# Patient Record
Sex: Female | Born: 1950 | Race: Black or African American | Hispanic: No | Marital: Single | Smoking: Current every day smoker
Health system: Southern US, Community
[De-identification: ages and names within clinical notes are randomized; demographics above are authoritative.]

## PROBLEM LIST (undated history)

## (undated) DIAGNOSIS — E785 Hyperlipidemia, unspecified: Secondary | ICD-10-CM

## (undated) DIAGNOSIS — E119 Type 2 diabetes mellitus without complications: Secondary | ICD-10-CM

## (undated) DIAGNOSIS — I10 Essential (primary) hypertension: Secondary | ICD-10-CM

## (undated) HISTORY — PX: OTHER SURGICAL HISTORY: SHX169

---

## 2015-01-12 ENCOUNTER — Emergency Department (HOSPITAL_BASED_OUTPATIENT_CLINIC_OR_DEPARTMENT_OTHER): Payer: Self-pay

## 2015-01-12 ENCOUNTER — Emergency Department (HOSPITAL_BASED_OUTPATIENT_CLINIC_OR_DEPARTMENT_OTHER)
Admission: EM | Admit: 2015-01-12 | Discharge: 2015-01-12 | Disposition: A | Discharge status: Home / Self Care | Payer: Self-pay | Attending: Emergency Medicine | Admitting: Emergency Medicine

## 2015-01-12 ENCOUNTER — Encounter (HOSPITAL_BASED_OUTPATIENT_CLINIC_OR_DEPARTMENT_OTHER): Payer: Self-pay | Admitting: Emergency Medicine

## 2015-01-12 DIAGNOSIS — E119 Type 2 diabetes mellitus without complications: Secondary | ICD-10-CM | POA: Insufficient documentation

## 2015-01-12 DIAGNOSIS — Z7982 Long term (current) use of aspirin: Secondary | ICD-10-CM | POA: Insufficient documentation

## 2015-01-12 DIAGNOSIS — I10 Essential (primary) hypertension: Secondary | ICD-10-CM | POA: Insufficient documentation

## 2015-01-12 DIAGNOSIS — Z720 Tobacco use: Secondary | ICD-10-CM | POA: Insufficient documentation

## 2015-01-12 DIAGNOSIS — K5900 Constipation, unspecified: Secondary | ICD-10-CM | POA: Insufficient documentation

## 2015-01-12 DIAGNOSIS — K5904 Chronic idiopathic constipation: Secondary | ICD-10-CM

## 2015-01-12 DIAGNOSIS — E785 Hyperlipidemia, unspecified: Secondary | ICD-10-CM | POA: Insufficient documentation

## 2015-01-12 DIAGNOSIS — Z79899 Other long term (current) drug therapy: Secondary | ICD-10-CM | POA: Insufficient documentation

## 2015-01-12 HISTORY — DX: Essential (primary) hypertension: I10

## 2015-01-12 HISTORY — DX: Hyperlipidemia, unspecified: E78.5

## 2015-01-12 HISTORY — DX: Type 2 diabetes mellitus without complications: E11.9

## 2015-01-12 MED ORDER — MINERAL OIL RE ENEM: 1.0000 | ENEMA | Freq: Once | RECTAL | Status: DC

## 2015-01-12 MED ORDER — DOCUSATE SODIUM 100 MG PO CAPS: 100.0000 mg | ORAL_CAPSULE | Freq: Two times a day (BID) | ORAL | Status: AC

## 2015-01-12 NOTE — ED Notes (Signed)
Pt sts constipation x4 days, has used suppositories at home without relief; has fullness and bloating in abdomen. Pain to RUQ, pt's abdomen appears to be distended and firm from triage assessment.

## 2015-01-12 NOTE — Discharge Instructions (Signed)
You have constipation, no obstruction seen. Please see your primary care doctor if not getting better to figure out the cause of your constipation.  Please return to the ER if your symptoms worsen; you have increased pain, inability to keep any medications, fluids down, and you stop passing gas. Otherwise see the outpatient doctor as requested.    Constipation Constipation is when a person has fewer than three bowel movements a week, has difficulty having a bowel movement, or has stools that are dry, hard, or larger than normal. As people grow older, constipation is more common. If you try to fix constipation with medicines that make you have a bowel movement (laxatives), the problem may get worse. Long-term laxative use may cause the muscles of the colon to become weak. A low-fiber diet, not taking in enough fluids, and taking certain medicines may make constipation worse.  CAUSES   Certain medicines, such as antidepressants, pain medicine, iron supplements, antacids, and water pills.   Certain diseases, such as diabetes, irritable bowel syndrome (IBS), thyroid disease, or depression.   Not drinking enough water.   Not eating enough fiber-rich foods.   Stress or travel.   Lack of physical activity or exercise.   Ignoring the urge to have a bowel movement.   Using laxatives too much.  SIGNS AND SYMPTOMS   Having fewer than three bowel movements a week.   Straining to have a bowel movement.   Having stools that are hard, dry, or larger than normal.   Feeling full or bloated.   Pain in the lower abdomen.   Not feeling relief after having a bowel movement.  DIAGNOSIS  Your health care provider will take a medical history and perform a physical exam. Further testing may be done for severe constipation. Some tests may include:  A barium enema X-ray to examine your rectum, colon, and, sometimes, your small intestine.   A sigmoidoscopy to examine your lower colon.    A colonoscopy to examine your entire colon. TREATMENT  Treatment will depend on the severity of your constipation and what is causing it. Some dietary treatments include drinking more fluids and eating more fiber-rich foods. Lifestyle treatments may include regular exercise. If these diet and lifestyle recommendations do not help, your health care provider may recommend taking over-the-counter laxative medicines to help you have bowel movements. Prescription medicines may be prescribed if over-the-counter medicines do not work.  HOME CARE INSTRUCTIONS   Eat foods that have a lot of fiber, such as fruits, vegetables, whole grains, and beans.  Limit foods high in fat and processed sugars, such as french fries, hamburgers, cookies, candies, and soda.   A fiber supplement may be added to your diet if you cannot get enough fiber from foods.   Drink enough fluids to keep your urine clear or pale yellow.   Exercise regularly or as directed by your health care provider.   Go to the restroom when you have the urge to go. Do not hold it.   Only take over-the-counter or prescription medicines as directed by your health care provider. Do not take other medicines for constipation without talking to your health care provider first.  SEEK IMMEDIATE MEDICAL CARE IF:   You have bright red blood in your stool.   Your constipation lasts for more than 4 days or gets worse.   You have abdominal or rectal pain.   You have thin, pencil-like stools.   You have unexplained weight loss. MAKE SURE YOU:  Understand these instructions.  Will watch your condition.  Will get help right away if you are not doing well or get worse. Document Released: 08/28/2004 Document Revised: 12/05/2013 Document Reviewed: 09/11/2013 [facility] Patient Information 2015 [place], [place]. This information is not intended to replace advice given to you by your health care provider. Make sure you discuss any questions  you have with your health care provider.

## 2015-01-12 NOTE — ED Provider Notes (Signed)
CSN: [id number]     Arrival date & time 01/12/15  0056 History   First MD Initiated Contact with Patient 01/12/15 0130     Chief Complaint  Patient presents with  . Constipation     (Consider location/radiation/quality/duration/timing/severity/associated sxs/prior Treatment) HPI Comments: Pt with hx of DM comes in with cc of constipation x 4 days. She has no hx of constipation, no hx of abd surgery. Reports attempting OTC meds with no relief. The last normal BM was 4 days ago, now she is having tiny stools with meds. She has no bloating, no n/v/ weight loss, night sweats. No uti like sx. Diabetic for 2 years.  Patient is a 64 y.o. female presenting with constipation. The history is provided by the patient.  Constipation Associated symptoms: no abdominal pain, no dysuria, no fever, no nausea and no vomiting     Past Medical History  Diagnosis Date  . Hypertension   . Diabetes mellitus without complication   . Hyperlipidemia    Past Surgical History  Procedure Laterality Date  . Cysts breast     No family history on file. History  Substance Use Topics  . Smoking status: Current Every Day Smoker -- 0.50 packs/day  . Smokeless tobacco: Not on file  . Alcohol Use: No   OB History    No data available     Review of Systems  Constitutional: Negative for fever, diaphoresis, activity change and unexpected weight change.  Gastrointestinal: Positive for constipation. Negative for nausea, vomiting and abdominal pain.  Genitourinary: Negative for dysuria and flank pain.  Allergic/Immunologic: Negative for food allergies.      Allergies  Review of patient's allergies indicates no known allergies.  Home Medications   Prior to Admission medications   Medication Sig Start Date End Date Taking? Authorizing Provider  aspirin 81 MG tablet Take 81 mg by mouth daily.   Yes Historical Provider, MD  docusate sodium (COLACE) 100 MG capsule Take 1 capsule (100 mg total) by mouth 2 (two)  times daily. 01/12/15   Derwood Kaplan, MD  glipiZIDE (GLUCOTROL) 5 MG tablet Take by mouth daily before breakfast.   Yes Historical Provider, MD  hydrochlorothiazide (HYDRODIURIL) 25 MG tablet Take 25 mg by mouth daily.   Yes Historical Provider, MD  losartan (COZAAR) 50 MG tablet Take 50 mg by mouth daily.   Yes Historical Provider, MD  metFORMIN (GLUCOPHAGE) 500 MG tablet Take 500 mg by mouth 2 (two) times daily with a meal.   Yes Historical Provider, MD  metoprolol (LOPRESSOR) 50 MG tablet Take 50 mg by mouth 2 (two) times daily.   Yes Historical Provider, MD  mineral oil enema Place 133 mLs (1 enema total) rectally once. 01/12/15   Derwood Kaplan, MD  rosuvastatin (CRESTOR) 20 MG tablet Take 20 mg by mouth daily.   Yes Historical Provider, MD   BP 154/88 mmHg  Pulse 72  Temp(Src) 98.1 F (36.7 C) (Oral)  Resp 16  Ht 5' 5.5" (1.664 m)  Wt 153 lb (69.4 kg)  BMI 25.06 kg/m2  SpO2 99% Physical Exam  Constitutional: She is oriented to person, place, and time. She appears well-developed and well-nourished.  HENT:  Head: Normocephalic and atraumatic.  Eyes: EOM are normal. Pupils are equal, round, and reactive to light.  Neck: Neck supple.  Cardiovascular: Normal rate, regular rhythm and normal heart sounds.   No murmur heard. Pulmonary/Chest: Effort normal. No respiratory distress.  Abdominal: Soft. She exhibits no distension. There is no tenderness. There  is no rebound and no guarding.  Soft stool in the vault, and the rectal vault didn't appear full  Neurological: She is alert and oriented to person, place, and time.  Skin: Skin is warm and dry.  Nursing note and vitals reviewed.   ED Course  Procedures (including critical care time) Labs Review Labs Reviewed - No data to display  Imaging Review Dg Abd 1 View  01/12/2015   CLINICAL DATA:  Constipation for 4 days. Fullness and bloating in the abdomen. Right upper quadrant pain. Distended and firm abdomen.  EXAM: ABDOMEN - 1  VIEW  COMPARISON:  None.  FINDINGS: Gas and stool throughout the colon. No small or large bowel distention. No radiopaque stones. Calcification in the pelvis likely represents a fibroid. Visualized bones appear intact. No radiopaque stones.  IMPRESSION: Nonobstructive bowel gas pattern with diffusely stool-filled colon. Calcified fibroid in the pelvis.   Electronically Signed   By: Burman NievesWilliam  Stevens M.D.   On: 01/12/2015 02:11     EKG Interpretation None      MDM   Final diagnoses:  Functional constipation    Pt comes in with constipation. KUB shows no obstruction. She is passing flatus, and there is no abd surgery. This is a new development for the patient, and so she has been advised to increase fiber in the diet, take OTc meds and drink plenty of fluids - and if not better see PCP for further investigation. PT requesting enema - which has been prescribed.   Derwood KaplanAnkit Gloriann Riede, MD 01/12/15 [phone]

## 2015-02-09 IMAGING — CR DG ABDOMEN 1V
1 series · 1 of 1 positions shown · non-contrast
Comparison: None.

CLINICAL DATA: Constipation for 4 days. Fullness and bloating in
the abdomen. Right upper quadrant pain. Distended and firm abdomen.

EXAM:
ABDOMEN - 1 VIEW

[t abdomen supine]
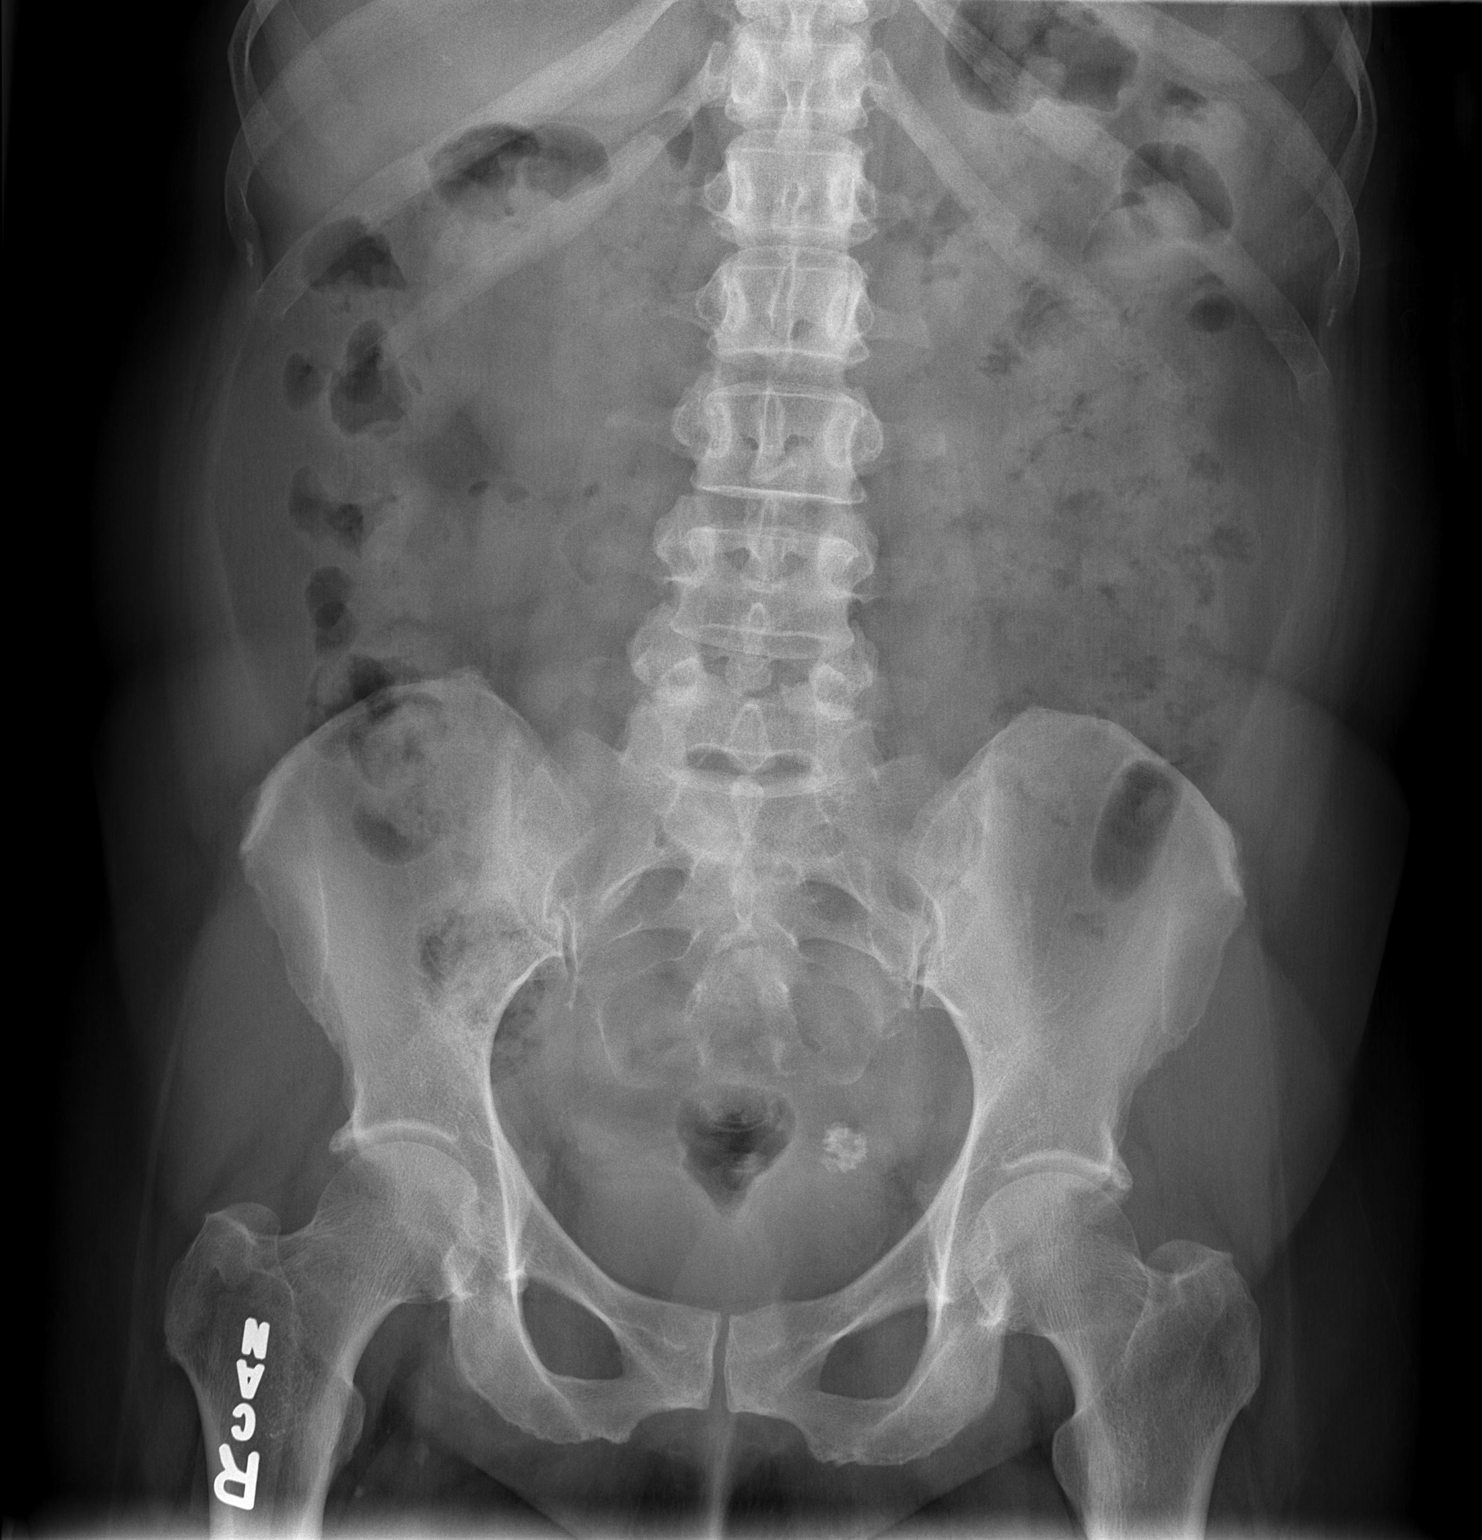

[1 of 1 positions shown; findings below may reference images not displayed]

FINDINGS: Gas and stool throughout the colon. No small or large bowel
distention. No radiopaque stones. Calcification in the pelvis likely
represents a fibroid. Visualized bones appear intact. No radiopaque
stones.
IMPRESSION: Nonobstructive bowel gas pattern with diffusely stool-filled colon.
Calcified fibroid in the pelvis.

## 2017-05-03 ENCOUNTER — Other Ambulatory Visit: Payer: Self-pay | Admitting: Family Medicine

## 2017-05-03 DIAGNOSIS — I701 Atherosclerosis of renal artery: Secondary | ICD-10-CM

## 2017-05-06 ENCOUNTER — Ambulatory Visit
Admission: RE | Admit: 2017-05-06 | Discharge: 2017-05-06 | Disposition: A | Discharge status: Home / Self Care | Payer: Self-pay | Source: Ambulatory Visit | Attending: Family Medicine | Admitting: Family Medicine

## 2017-05-06 ENCOUNTER — Other Ambulatory Visit: Payer: Self-pay | Admitting: *Deleted

## 2017-05-06 DIAGNOSIS — I10 Essential (primary) hypertension: Secondary | ICD-10-CM

## 2017-05-06 DIAGNOSIS — I701 Atherosclerosis of renal artery: Secondary | ICD-10-CM

## 2017-05-06 HISTORY — PX: IR RADIOLOGIST EVAL & MGMT: IMG5224

## 2017-05-06 NOTE — Consult Note (Signed)
Chief Complaint: Patient was seen in consultation today for  Chief Complaint  Patient presents with  . Advice Only    Consult for Renal Artery Stenosis/Hypertension   at the request of Leslie Meza  Referring Physician(s): Leslie Meza  History of Present Illness: Leslie Meza is a 66 y.o. female with a history of uncontrolled hypertension. She also has a history of diabetes mellitus, hypercholesterolemia, MI, and TIA. She was on 4 medications for high blood pressure. The beta blocker was recently removed. Her blood pressure has been difficult to control and usually her systolic runs over 170. She also recently had an episode of a hypertensive crisis earlier this year. At that time, her blood pressure reached 210/82. This was successfully treated with clonidine. Currently she denies any chest pain or shortness of breath. He is asymptomatic at this time. She did undergo a screening renal artery Doppler examination. Recent blood work earlier this year demonstrates that her renal function is normal. Doppler suggests significant stenosis in the right renal artery and severe atrophy of the left kidney.  Past Medical History:  Diagnosis Date  . Diabetes mellitus without complication   . Hyperlipidemia   . Hypertension     Past Surgical History:  Procedure Laterality Date  . cysts breast      Allergies: Influenza vaccines  Medications: Prior to Admission medications   Medication Sig Start Date End Date Taking? Authorizing Provider  albuterol (PROVENTIL HFA;VENTOLIN HFA) 108 (90 Base) MCG/ACT inhaler Inhale 1 puff into the lungs every 6 (six) hours as needed for wheezing or shortness of breath.   Yes [provider]  amLODipine (NORVASC) 5 MG tablet Take 5 mg by mouth daily.   Yes [provider]  aspirin 325 MG tablet Take 325 mg by mouth daily.   Yes [provider]  atorvastatin (LIPITOR) 40 MG tablet Take 40 mg by mouth daily.   Yes [provider]  benazepril (LOTENSIN) 40 MG tablet Take 40 mg by mouth daily.   Yes [provider]  glipiZIDE (GLUCOTROL) 5 MG tablet Take by mouth daily before breakfast.   Yes [provider]  loratadine (CLARITIN) 10 MG tablet Take 10 mg by mouth daily as needed for allergies.   Yes [provider]  metFORMIN (GLUCOPHAGE) 500 MG tablet Take 500 mg by mouth 2 (two) times daily with a meal.   Yes [provider]  polyethylene glycol (MIRALAX / GLYCOLAX) packet Take 17 g by mouth daily as needed.   Yes [provider]  ranitidine (ZANTAC) 150 MG capsule Take 150 mg by mouth 2 (two) times daily.   Yes [provider]  spironolactone (ALDACTONE) 25 MG tablet Take 25 mg by mouth daily.   Yes [provider]  aspirin 81 MG tablet Take 81 mg by mouth daily.    [provider]  docusate sodium (COLACE) 100 MG capsule Take 1 capsule (100 mg total) by mouth 2 (two) times daily. Patient not taking: Reported on 05/06/2017 01/12/15   Derwood Kaplan, MD  hydrochlorothiazide (HYDRODIURIL) 25 MG tablet Take 25 mg by mouth daily.    [provider]  losartan (COZAAR) 50 MG tablet Take 50 mg by mouth daily.    [provider]  metoprolol (LOPRESSOR) 50 MG tablet Take 50 mg by mouth 2 (two) times daily.    [provider]  mineral oil enema Place 133 mLs (1 enema total) rectally once. Patient not taking: Reported on 05/06/2017 01/12/15   Rhunette Croft,  Ankit, MD  rosuvastatin (CRESTOR) 20 MG tablet Take 20 mg by mouth daily.    [provider]     No family history on file.  Social History   Social History  . Marital status: Single    Spouse name: N/A  . Number of children: N/A  . Years of education: N/A   Social History Main Topics  . Smoking status: Current Every Day Smoker    Packs/day: 0.50  . Smokeless tobacco: Not on file  . Alcohol use No  . Drug use: No  . Sexual activity: Not on file    Other Topics Concern  . Not on file   Social History Narrative  . No narrative on file     Review of Systems: A 12 point ROS discussed and pertinent positives are indicated in the HPI above.  All other systems are negative.  Review of Systems  Vital Signs: BP (!) 152/66   Pulse (!) 52   Temp 98.1 F (36.7 C) (Oral)   Resp 14   Ht 5' 5.5" (1.664 m)   Wt 140 lb (63.5 kg)   SpO2 99%   BMI 22.94 kg/m   Physical Exam  Constitutional: She is oriented to person, place, and time. She appears well-developed and well-nourished.  HENT:  Head: Normocephalic and atraumatic.  Cardiovascular: Normal rate, regular rhythm and normal heart sounds.   Femoral pulses are 2+ bilaterally  Dorsalis pedis and posterior tibial pulses are nonpalpable but dopplerable bilaterally.  Pulmonary/Chest: Effort normal and breath sounds normal.  Neurological: She is alert and oriented to person, place, and time.    Mallampati Score:     Imaging: No results found.  Labs:  CBC: No results for input(s): WBC, HGB, HCT, PLT in the last 8760 hours.  COAGS: No results for input(s): INR, APTT in the last 8760 hours.  BMP: No results for input(s): NA, K, CL, CO2, GLUCOSE, BUN, CALCIUM, CREATININE, GFRNONAA, GFRAA in the last 8760 hours.  Invalid input(s): CMP  LIVER FUNCTION TESTS: No results for input(s): BILITOT, AST, ALT, ALKPHOS, PROT, ALBUMIN in the last 8760 hours.  TUMOR MARKERS: No results for input(s): AFPTM, CEA, CA199, CHROMGRNA in the last 8760 hours.  Assessment and Plan:  Ms. Leslie Meza has uncontrolled hypertension and an abnormal renal artery Doppler examination. CT angiogram of her renal arteries will be performed. If she does have significant narrowing in her right renal artery, this should be amenable to endovascular treatment such as angioplasty and stenting. I did discuss the procedure, risks, and benefits. She understands. Her questions were answered.  Thank you for this  interesting consult.  I greatly enjoyed meeting Leslie Meza and look forward to participating in their care.  A copy of this report was sent to the requesting provider on this date.  Electronically Signed: [name], ART A 05/06/2017, 4:10 PM   I spent a total of  40 Minutes   in face to face in clinical consultation, greater than 50% of which was counseling/coordinating care for renal artery stenosis.

## 2017-05-24 ENCOUNTER — Other Ambulatory Visit (HOSPITAL_COMMUNITY): Payer: Self-pay | Admitting: Interventional Radiology

## 2017-05-24 DIAGNOSIS — I701 Atherosclerosis of renal artery: Secondary | ICD-10-CM

## 2017-05-28 ENCOUNTER — Encounter: Payer: Self-pay | Admitting: Family Medicine

## 2017-06-02 ENCOUNTER — Ambulatory Visit
Admission: RE | Admit: 2017-06-02 | Discharge: 2017-06-02 | Disposition: A | Discharge status: Home / Self Care | Payer: Medicaid Other | Source: Ambulatory Visit | Attending: Interventional Radiology | Admitting: Interventional Radiology

## 2017-06-02 DIAGNOSIS — I701 Atherosclerosis of renal artery: Secondary | ICD-10-CM

## 2017-06-02 HISTORY — PX: IR RADIOLOGIST EVAL & MGMT: IMG5224

## 2017-06-02 NOTE — Progress Notes (Signed)
Patient ID: Leslie Meza, female   DOB: 11/16/1951, 66 y.o.   MRN: [id number]       Chief Complaint: Patient was seen in consultation today for  Chief Complaint  Patient presents with  . Follow-up    follow up Angioplasty of Right Renal Artery Stenosis     at the request of Wren Gallaga  Referring Physician(s): Leighton Brickley  History of Present Illness: Leslie Meza is a 66 y.o. female who underwent right renal artery stenting and angioplasty 2 weeks ago for uncontrolled hypertension. She is on several blood pressure medications. Her pressure remained high despite aggressive therapy. 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 Since the procedure, she denies any complaints. She denies any right groin swelling or pain. Her urine output has been adequate.  Past Medical History:  Diagnosis Date  . Diabetes mellitus without complication (HCC)   . Hyperlipidemia   . Hypertension     Past Surgical History:  Procedure Laterality Date  . cysts breast    . IR RADIOLOGIST EVAL & MGMT  05/06/2017    Allergies: Influenza vaccines  Medications: Prior to Admission medications   Medication Sig Start Date End Date Taking? Authorizing Provider  albuterol (PROVENTIL HFA;VENTOLIN HFA) 108 (90 Base) MCG/ACT inhaler Inhale 1 puff into the lungs every 6 (six) hours as needed for wheezing or shortness of breath.   Yes [provider]  amLODipine (NORVASC) 5 MG tablet Take 5 mg by mouth daily.   Yes [provider]  aspirin 325 MG tablet Take 325 mg by mouth daily.   Yes [provider]  atorvastatin (LIPITOR) 40 MG tablet Take 40 mg by mouth daily.   Yes [provider]  benazepril (LOTENSIN) 40 MG tablet Take 40 mg by mouth daily.   Yes [provider]  docusate sodium (COLACE) 100 MG capsule Take 1 capsule (100 mg total) by  mouth 2 (two) times daily. 01/12/15  Yes Nanavati, Ankit, MD  glipiZIDE (GLUCOTROL) 5 MG tablet Take by mouth daily before breakfast.   Yes [provider]  loratadine (CLARITIN) 10 MG tablet Take 10 mg by mouth daily as needed for allergies.   Yes [provider]  losartan (COZAAR) 50 MG tablet Take 50 mg by mouth daily.   Yes [provider]  metFORMIN (GLUCOPHAGE) 500 MG tablet Take 500 mg by mouth 2 (two) times daily with a meal.   Yes [provider]  mineral oil enema Place 133 mLs (1 enema total) rectally once. 01/12/15  Yes Nanavati, Ankit, MD  polyethylene glycol (MIRALAX / GLYCOLAX) packet Take 17 g by mouth daily as needed.   Yes [provider]  ranitidine (ZANTAC) 150 MG capsule Take 150 mg by mouth 2 (two) times daily.   Yes [provider]  spironolactone (ALDACTONE) 25 MG tablet Take 25 mg by mouth daily.   Yes [provider]  aspirin 81 MG tablet Take 81 mg by mouth daily.    [provider]  hydrochlorothiazide (HYDRODIURIL) 25 MG tablet Take 25 mg by mouth daily.    [provider]  metoprolol (LOPRESSOR) 50 MG tablet Take 50 mg by mouth 2 (two) times daily.    [provider]  rosuvastatin (CRESTOR) 20 MG tablet Take 20 mg by mouth daily.    [provider]     No family history on file.  Social  History   Social History  . Marital status: Single    Spouse name: N/A  . Number of children: N/A  . Years of education: N/A   Social History Main Topics  . Smoking status: Current Every Day Smoker    Packs/day: 0.50  . Smokeless tobacco: Not on file  . Alcohol use No  . Drug use: No  . Sexual activity: Not on file   Other Topics Concern  . Not on file   Social History Narrative  . No narrative on file      Review of Systems: A 12 point ROS discussed and pertinent positives are indicated in the HPI above.  All other systems are negative.  Review of Systems  Vital  Signs: BP 134/71   Pulse 73   Temp 98.1 F (36.7 C) (Oral)   Resp 14   Ht 5' 5.5" (1.664 m)   Wt 140 lb (63.5 kg)   SpO2 100%   BMI 22.94 kg/m   Physical Exam  Constitutional: She is oriented to person, place, and time. She appears well-developed and well-nourished.  Cardiovascular: Normal rate and regular rhythm.   Pulmonary/Chest: Effort normal and breath sounds normal.  Musculoskeletal:  Right groin is soft without evidence of hematoma or pseudoaneurysm. Pulses are intact distally.  Neurological: She is alert and oriented to person, place, and time.    Mallampati Score:     Imaging: Ir Radiologist Eval & Mgmt  Result Date: 05/28/2017 Please refer to notes tab for details about interventional procedure. (Op Note)   Labs:  CBC: No results for input(s): WBC, HGB, HCT, PLT in the last 8760 hours.  COAGS: No results for input(s): INR, APTT in the last 8760 hours.  BMP: No results for input(s): NA, K, CL, CO2, GLUCOSE, BUN, CALCIUM, CREATININE, GFRNONAA, GFRAA in the last 8760 hours.  Invalid input(s): CMP  LIVER FUNCTION TESTS: No results for input(s): BILITOT, AST, ALT, ALKPHOS, PROT, ALBUMIN in the last 8760 hours.  TUMOR MARKERS: No results for input(s): AFPTM, CEA, CA199, CHROMGRNA in the last 8760 hours.  Assessment and Plan:  Ms. Leslie Meza has done well after right renal artery stenting. Her systolic pressure is 130 mmHg today which is almost in normal range. Hopefully, her blood pressure medication regimen can be somewhat curtailed. She will follow-up in 6 months with a renal arterial Doppler study. She was instructed to continue her medical therapy including Plavix for at least 6 months.  Thank you for this interesting consult.  I greatly enjoyed meeting Leslie Meza and look forward to participating in their care.  A copy of this report was sent to the requesting provider on this date.  Electronically Signed: Lourdes Kucharski, ART A 06/02/2017, 2:30 PM   I spent a  total of   15 Minutes in face to face in clinical consultation, greater than 50% of which was counseling/coordinating care for renal artery stenting for uncontrolled hypertension.

## 2017-07-08 ENCOUNTER — Telehealth: Payer: Self-pay

## 2017-07-08 NOTE — Progress Notes (Signed)
I returned [name], PA's call about this patient coming off her Plavix for elective rotator cuff surgery ASAP.  [place]  Ms. Leslie Meza is a patient of Dr. Bonnielee HaffHoss', and he is on vacation this week, so I asked Dr. Deanne CofferHassell about this. Dr. Deanne CofferHassell states this patient needs to remain on Plavix after stenting of her right renal artery May 19, 2017 (especially since this is her only functioning kidney) for a minimum of 2-3 months.  I told Dwayne I will follow up with him on July 12, 2017 when Dr. Bonnielee HaffHoss is back in town to clarify the length of time before Ms. Leslie Meza may have this elective surgery.  Donell SievertJeanne Carlyann Placide, RN

## 2017-10-21 ENCOUNTER — Encounter: Payer: Self-pay | Admitting: Interventional Radiology

## 2017-10-27 ENCOUNTER — Other Ambulatory Visit (HOSPITAL_COMMUNITY): Payer: Self-pay | Admitting: Interventional Radiology

## 2017-10-27 ENCOUNTER — Encounter: Payer: Self-pay | Admitting: Radiology

## 2017-10-27 DIAGNOSIS — I701 Atherosclerosis of renal artery: Secondary | ICD-10-CM

## 2017-10-28 ENCOUNTER — Other Ambulatory Visit (HOSPITAL_COMMUNITY): Payer: Self-pay | Admitting: Interventional Radiology

## 2017-11-16 ENCOUNTER — Encounter: Payer: Self-pay | Admitting: *Deleted

## 2017-11-16 ENCOUNTER — Ambulatory Visit
Admission: RE | Admit: 2017-11-16 | Discharge: 2017-11-16 | Disposition: A | Discharge status: Home / Self Care | Payer: Medicaid Other | Source: Ambulatory Visit | Attending: Interventional Radiology | Admitting: Interventional Radiology

## 2017-11-16 ENCOUNTER — Other Ambulatory Visit (HOSPITAL_COMMUNITY): Payer: Self-pay | Admitting: Interventional Radiology

## 2017-11-16 DIAGNOSIS — I701 Atherosclerosis of renal artery: Secondary | ICD-10-CM

## 2017-11-16 HISTORY — PX: IR RADIOLOGIST EVAL & MGMT: IMG5224

## 2017-11-16 NOTE — Progress Notes (Signed)
Chief Complaint: Patient was seen in consultation today for  Chief Complaint  Patient presents with  . Follow-up   at the request of Amaurie Wandel  Referring Physician(s): Sayda Grable  History of Present Illness: Leslie Meza is a 66 y.o. female who has a history of uncontrolled hypertension who underwent renal artery stent June of this year. Since then, she has done very well. Her blood pressure has been controlled on her regiment and oftentimes is somewhat low. According to her, her regimen has not been altered but I have no office notes from her primary care physician. She denies any headaches or syncopal episodes. She continues dual antiplatelet therapy with aspirin and Plavix at 325 and 75 mg respectively.  Past Medical History:  Diagnosis Date  . Diabetes mellitus without complication (HCC)   . Hyperlipidemia   . Hypertension     Allergies: Influenza vaccines  Medications: Prior to Admission medications   Medication Sig Start Date End Date Taking? Authorizing Provider  amLODipine (NORVASC) 5 MG tablet Take 10 mg by mouth daily.    Yes [provider]  aspirin 325 MG tablet Take 325 mg by mouth daily.   Yes [provider]  atorvastatin (LIPITOR) 40 MG tablet Take 40 mg by mouth daily.   Yes [provider]  benazepril (LOTENSIN) 40 MG tablet Take 40 mg by mouth daily.   Yes [provider]  docusate sodium (COLACE) 100 MG capsule Take 1 capsule (100 mg total) by mouth 2 (two) times daily. 01/12/15  Yes Nanavati, Ankit, MD  glipiZIDE (GLUCOTROL) 5 MG tablet Take by mouth daily before breakfast.   Yes [provider]  loratadine (CLARITIN) 10 MG tablet Take 10 mg by mouth daily as needed for allergies.   Yes [provider]  losartan (COZAAR) 50 MG tablet Take 50 mg by mouth daily.   Yes [provider]  metFORMIN (GLUCOPHAGE) 500 MG tablet Take 500 mg by mouth 2 (two) times daily with a meal.   Yes [provider]  polyethylene glycol (MIRALAX / GLYCOLAX) packet Take 17 g by mouth daily as needed.   Yes [provider]  ranitidine (ZANTAC) 150 MG capsule Take 150 mg by mouth 2 (two) times daily.   Yes [provider]  rosuvastatin (CRESTOR) 20 MG tablet Take 20 mg by mouth daily.   Yes [provider]  spironolactone (ALDACTONE) 25 MG tablet Take 25 mg by mouth daily.   Yes [provider]  albuterol (PROVENTIL HFA;VENTOLIN HFA) 108 (90 Base) MCG/ACT inhaler Inhale 1 puff into the lungs every 6 (six) hours as needed for wheezing or shortness of breath.    [provider]  aspirin 81 MG tablet Take 81 mg by mouth daily.    [provider]  hydrochlorothiazide (HYDRODIURIL) 25 MG tablet Take 25 mg by mouth daily.    [provider]  metoprolol (LOPRESSOR) 50 MG tablet Take 50 mg by mouth 2 (two) times daily.    [provider]  mineral oil enema Place 133 mLs (1 enema total) rectally once. Patient not taking: Reported on 11/16/2017 01/12/15   Derwood KaplanNanavati, Ankit, MD     No family history on file.  Social History   Socioeconomic History  . Marital status: Single    Spouse name: Not on file  . Number of children: Not on file  . Years of education: Not on file  . Highest education level: Not on file  Social Needs  . Financial  resource strain: Not on file  . Food insecurity - worry: Not on file  . Food insecurity - inability: Not on file  . Transportation needs - medical: Not on file  . Transportation needs - non-medical: Not on file  Occupational History  . Not on file  Tobacco Use  . Smoking status: Current Every Day Smoker    Packs/day: 0.50  Substance and Sexual Activity  . Alcohol use: No  . Drug use: No  . Sexual activity: Not on file  Other Topics Concern  . Not on file  Social History Narrative  . Not on file     Review of Systems: A 12 point ROS discussed and pertinent positives are indicated in the  HPI above.  All other systems are negative.  Review of Systems  Vital Signs: BP (!) 143/78   Pulse 74   Temp 98 F (36.7 C) (Oral)   Resp 15   Ht 5' 5.5" (1.664 m)   Wt 143 lb (64.9 kg)   SpO2 100%   BMI 23.43 kg/m   Physical Exam  Constitutional: She is oriented to person, place, and time. She appears well-developed and well-nourished.  HENT:  Head: Normocephalic and atraumatic.  Neurological: She is alert and oriented to person, place, and time.  Skin: Skin is warm and dry.     Imaging: [facility]  Result Date: 11/16/2017 CLINICAL DATA:  Follow-up right renal artery stent. History of uncontrolled hypertension. EXAM: RENAL/URINARY TRACT ULTRASOUND RENAL DUPLEX DOPPLER ULTRASOUND COMPARISON:  04/29/2017 FINDINGS: Right Kidney: Length: 11.1 cm. Echogenicity within normal limits. No mass or hydronephrosis visualized. Left Kidney: Length: Not visualized. Bladder:  Decompressed but within normal limits. RENAL DUPLEX ULTRASOUND Right Renal Artery Velocities: Origin:  267 cm/sec Mid:  166 cm/sec Hilum:  152 cm/sec Interlobar:  59 cm/sec Arcuate:  37 Cm/sec Aortic Velocity:  102 Cm/sec Right Renal-Aortic Ratios: Origin: 2.6 Mid:  1.6 Hilum: 1.5 Interlobar: 0.6 Arcuate: 0.4 IMPRESSION: Overall, velocities in the right renal artery have significantly improved compared with pre renal artery stent measurements. Peak velocity at the origin has diminished from 310 to 267 cm/sec and the ratio has dropped from 4.0 to 2.6. The left kidney was nonvisualized due to atrophy. Electronically Signed   By: Jolaine ClickArthur  Lovelyn Sheeran M.D.   On: 11/16/2017 11:27   [place] [facility] Artery Duplex Complete  Result Date: 11/16/2017 CLINICAL DATA:  Follow-up right renal artery stent. History of uncontrolled hypertension. EXAM: RENAL/URINARY TRACT ULTRASOUND RENAL DUPLEX DOPPLER ULTRASOUND COMPARISON:  04/29/2017 FINDINGS: Right Kidney: Length: 11.1 cm. Echogenicity within normal limits. No mass or hydronephrosis visualized. Left Kidney:  Length: Not visualized. Bladder:  Decompressed but within normal limits. RENAL DUPLEX ULTRASOUND Right Renal Artery Velocities: Origin:  267 cm/sec Mid:  166 cm/sec Hilum:  152 cm/sec Interlobar:  59 cm/sec Arcuate:  37 Cm/sec Aortic Velocity:  102 Cm/sec Right Renal-Aortic Ratios: Origin: 2.6 Mid:  1.6 Hilum: 1.5 Interlobar: 0.6 Arcuate: 0.4 IMPRESSION: Overall, velocities in the right renal artery have significantly improved compared with pre renal artery stent measurements. Peak velocity at the origin has diminished from 310 to 267 cm/sec and the ratio has dropped from 4.0 to 2.6. The left kidney was nonvisualized due to atrophy. Electronically Signed   By: Jolaine ClickArthur  Derald Lorge M.D.   On: 11/16/2017 11:27   [facility]  Result Date: 11/16/2017 Please refer to notes tab for details about interventional procedure. (Op Note)   Labs:  CBC: No results for input(s): WBC, HGB, HCT, PLT  in the last 8760 hours.  COAGS: No results for input(s): INR, APTT in the last 8760 hours.  BMP: No results for input(s): NA, K, CL, CO2, GLUCOSE, BUN, CALCIUM, CREATININE, GFRNONAA, GFRAA in the last 8760 hours.  Invalid input(s): CMP  LIVER FUNCTION TESTS: No results for input(s): BILITOT, AST, ALT, ALKPHOS, PROT, ALBUMIN in the last 8760 hours.  TUMOR MARKERS: No results for input(s): AFPTM, CEA, CA199, CHROMGRNA in the last 8760 hours.  Assessment and Plan:  Ms. Cutter has done very well after right renal artery stenting. The noninvasive study today shows a marked improvement in her velocities. They remain slightly elevated, however this may simply be a factor related to the metal stent. Her blood pressure has also been controlled and at times is low, therefore hopefully some of her medication can be scaled back. The stent was a 6 mm device, therefore I am recommending Plavix for a total of 6 months which ends today. She will continue on full strength aspirin therapy alone indefinitely. She will  follow-up in one year with a repeat ultrasound and evaluation of her right renal artery.  Thank you for this interesting consult.  I greatly enjoyed meeting Jameria Bradway and look forward to participating in their care.  A copy of this report was sent to the requesting provider on this date.  Electronically Signed: Kierria Feigenbaum, ART A 11/16/2017, 11:32 AM   I spent a total of   15 Minutes in face to face in clinical consultation, greater than 50% of which was counseling/coordinating care for right renal artery stenting.

## 2018-10-28 ENCOUNTER — Other Ambulatory Visit (HOSPITAL_COMMUNITY): Payer: Self-pay | Admitting: Interventional Radiology

## 2018-10-28 DIAGNOSIS — I701 Atherosclerosis of renal artery: Secondary | ICD-10-CM

## 2018-11-23 ENCOUNTER — Other Ambulatory Visit: Payer: Medicaid Other

## 2018-12-29 ENCOUNTER — Ambulatory Visit
Admission: RE | Admit: 2018-12-29 | Discharge: 2018-12-29 | Disposition: A | Discharge status: Home / Self Care | Payer: Medicaid Other | Source: Ambulatory Visit | Attending: Interventional Radiology | Admitting: Interventional Radiology

## 2018-12-29 ENCOUNTER — Encounter: Payer: Self-pay | Admitting: *Deleted

## 2018-12-29 ENCOUNTER — Ambulatory Visit
Admission: RE | Admit: 2018-12-29 | Discharge: 2018-12-29 | Disposition: A | Discharge status: Home / Self Care | Payer: Medicare HMO | Source: Ambulatory Visit | Attending: Interventional Radiology | Admitting: Interventional Radiology

## 2018-12-29 DIAGNOSIS — I701 Atherosclerosis of renal artery: Secondary | ICD-10-CM

## 2018-12-29 HISTORY — PX: IR RADIOLOGIST EVAL & MGMT: IMG5224

## 2018-12-29 NOTE — Progress Notes (Signed)
Patient ID: Leslie Meza, female   DOB: 1951/11/28, 68 y.o.   MRN: [id number]       Chief Complaint: Patient was seen in consultation today for  Chief Complaint  Patient presents with  . Follow-up   at the request of Leslie Meza  Referring Physician(s): Leslie Meza  History of Present Illness: Leslie Meza is a 68 y.o. female who is 1 year status post renal artery stenting.  The procedure was performed for uncontrolled hypertension.  Over the last year, her blood pressure has been very well controlled.  She is still on multiple medications without taper.  She denies any symptoms of dizziness or headaches.  She also denies chest pain, strokelike symptoms, or lower extremity claudication.  Past Medical History:  Diagnosis Date  . Diabetes mellitus without complication (HCC)   . Hyperlipidemia   . Hypertension      Allergies: Influenza vaccines  Medications: Prior to Admission medications   Medication Sig Start Date End Date Taking? Authorizing Provider  albuterol (PROVENTIL HFA;VENTOLIN HFA) 108 (90 Base) MCG/ACT inhaler Inhale 1 puff into the lungs every 6 (six) hours as needed for wheezing or shortness of breath.   Yes [provider]  amLODipine (NORVASC) 5 MG tablet Take 10 mg by mouth daily.    Yes [provider]  aspirin 325 MG tablet Take 325 mg by mouth daily.   Yes [provider]  atorvastatin (LIPITOR) 40 MG tablet Take 40 mg by mouth daily.   Yes [provider]  benazepril (LOTENSIN) 40 MG tablet Take 40 mg by mouth daily.   Yes [provider]  docusate sodium (COLACE) 100 MG capsule Take 1 capsule (100 mg total) by mouth 2 (two) times daily. 01/12/15  Yes Nanavati, Ankit, MD  glipiZIDE (GLUCOTROL) 5 MG tablet Take by mouth daily before breakfast.   Yes [provider]  loratadine (CLARITIN) 10 MG tablet Take 10 mg by mouth daily as needed for allergies.   Yes [provider]  losartan (COZAAR) 50 MG tablet  Take 50 mg by mouth daily.   Yes [provider]  metFORMIN (GLUCOPHAGE) 500 MG tablet Take 500 mg by mouth 2 (two) times daily with a meal.   Yes [provider]  metoprolol (LOPRESSOR) 50 MG tablet Take 50 mg by mouth 2 (two) times daily.   Yes [provider]  polyethylene glycol (MIRALAX / GLYCOLAX) packet Take 17 g by mouth daily as needed.   Yes [provider]  ranitidine (ZANTAC) 150 MG capsule Take 150 mg by mouth 2 (two) times daily.   Yes [provider]  spironolactone (ALDACTONE) 25 MG tablet Take 25 mg by mouth daily.   Yes [provider]     No family history on file.  Social History   Socioeconomic History  . Marital status: Single    Spouse name: Not on file  . Number of children: Not on file  . Years of education: Not on file  . Highest education level: Not on file  Occupational History  . Not on file  Social Needs  . Financial resource strain: Not on file  . Food insecurity:    Worry: Not on file    Inability: Not on file  . Transportation needs:    Medical: Not on file    Non-medical: Not on file  Tobacco Use  . Smoking status: Current Every Day Smoker    Packs/day: 0.50  Substance and Sexual Activity  . Alcohol use: No  .  Drug use: No  . Sexual activity: Not on file  Lifestyle  . Physical activity:    Days per week: Not on file    Minutes per session: Not on file  . Stress: Not on file  Relationships  . Social connections:    Talks on phone: Not on file    Gets together: Not on file    Attends religious service: Not on file    Active member of club or organization: Not on file    Attends meetings of clubs or organizations: Not on file    Relationship status: Not on file  Other Topics Concern  . Not on file  Social History Narrative  . Not on file     Review of Systems: A 12 point ROS discussed and pertinent positives are indicated in the HPI above.  All other systems are  negative.  Review of Systems  Vital Signs: BP 129/66   Pulse 64   Temp 97.8 F (36.6 C) (Oral)   Resp 14   Ht 5' 5.5" (1.664 m)   Wt 66.7 kg   SpO2 100%   BMI 24.09 kg/m   Physical Exam She is in no apparent distress.  Neurological exam is nonfocal.  Imaging: [facility] Eval & Mgmt  Result Date: 12/29/2018 Please refer to notes tab for details about interventional procedure. (Op Note) A renal Doppler study was performed today.  The renal artery is patent but velocities in the right renal artery have slightly increased since her prior study.  Her right renal artery origin to aortic velocity ratio is now 4.0.  The left renal artery and left kidney again were nonvisualized.  Labs:  CBC: No results for input(s): WBC, HGB, HCT, PLT in the last 8760 hours.  COAGS: No results for input(s): INR, APTT in the last 8760 hours.  BMP: No results for input(s): NA, K, CL, CO2, GLUCOSE, BUN, CALCIUM, CREATININE, GFRNONAA, GFRAA in the last 8760 hours.  Invalid input(s): CMP  LIVER FUNCTION TESTS: No results for input(s): BILITOT, AST, ALT, ALKPHOS, PROT, ALBUMIN in the last 8760 hours.  TUMOR MARKERS: No results for input(s): AFPTM, CEA, CA199, CHROMGRNA in the last 8760 hours.  Assessment and Plan:  Ms. Leslie Meza has done very well after renal artery stenting for essentially a solitary right kidney.  Her blood pressure is well controlled.  She is on a full-strength aspirin only which should be adequate.  There is no blood work in our system so I am not sure what her creatinine level is.  She does see a primary care physician as well as a nephrologist.  Although the velocities in her right renal artery have crept up, I am reluctant to proceed with further imaging given that her blood pressure is well controlled.  If there is intention to taper some of her medications and her hypertension becomes uncontrolled or if renal insufficiency develops, further imaging would be indicated with  subsequent therapy if there is narrowing of the right renal artery.  It is very possible that the elevated velocity is a technical factor related to the metal stent rather than true narrowing.  Thank you for this interesting consult.  I greatly enjoyed meeting Leslie Meza and look forward to participating in their care.  A copy of this report was sent to the requesting provider on this date.  Electronically Signed: Art A Jaelen Soth 12/29/2018, 11:18 AM   I spent a total of   15 Minutes in face to face in clinical consultation,  greater than 50% of which was counseling/coordinating care for renal artery stenting.

## 2019-06-13 IMAGING — US US RENAL
1 series · 14 of 25 positions shown · non-contrast
Comparison: 04/29/2017

CLINICAL DATA: Follow-up right renal artery stent. History of
uncontrolled hypertension.

EXAM:
RENAL/URINARY TRACT ULTRASOUND
RENAL DUPLEX DOPPLER ULTRASOUND

[Series 1: us renal · 0.23mm/px · 14 of 36 slices shown]
[im 1/36]
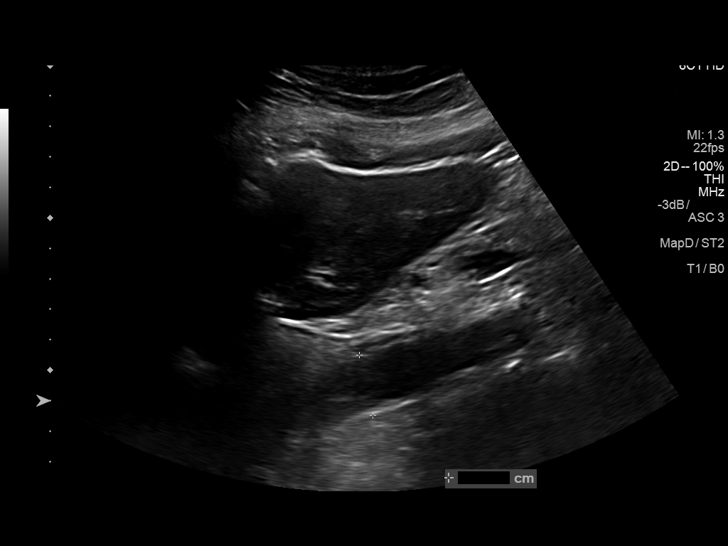
[im 3/36]
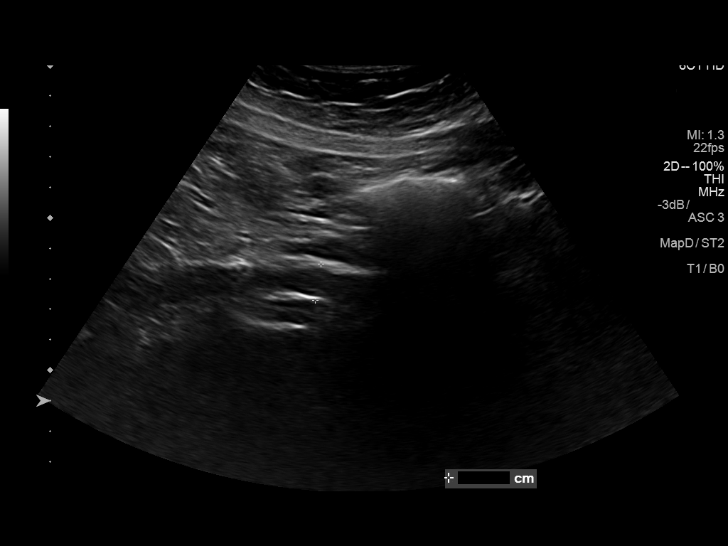
[im 6/36]
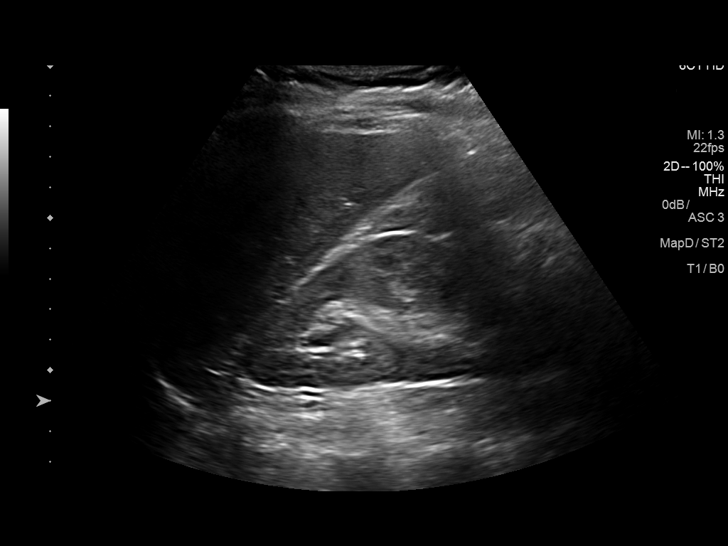
[im 9/36]
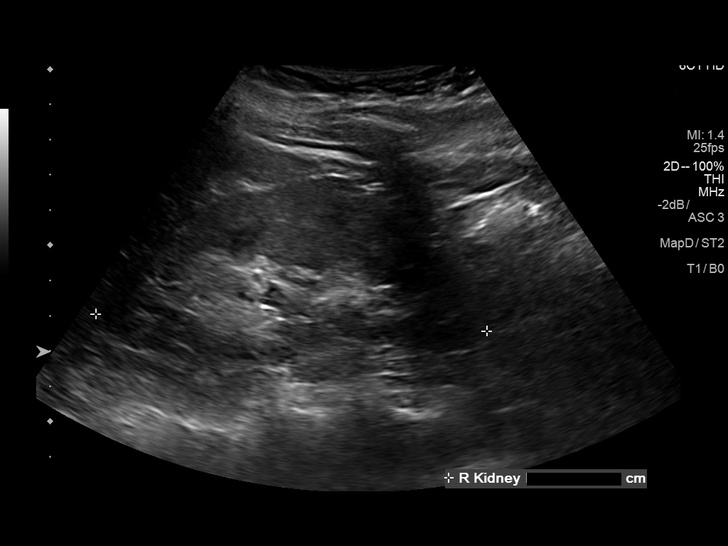
[im 12/36]
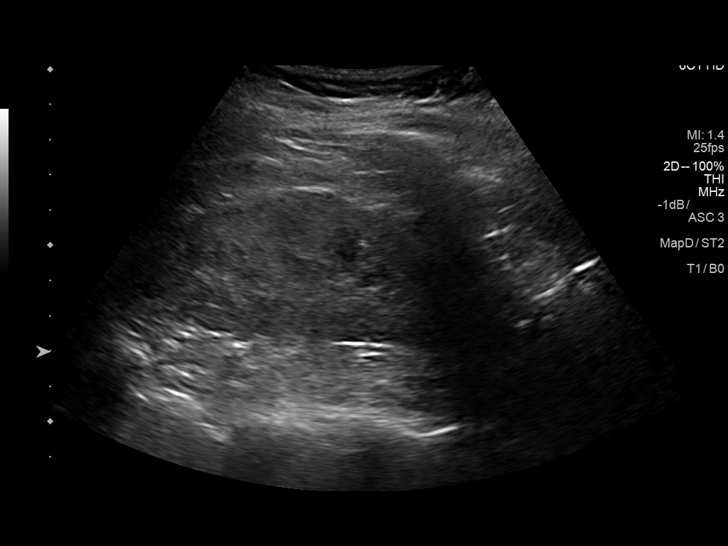
[im 14/36]
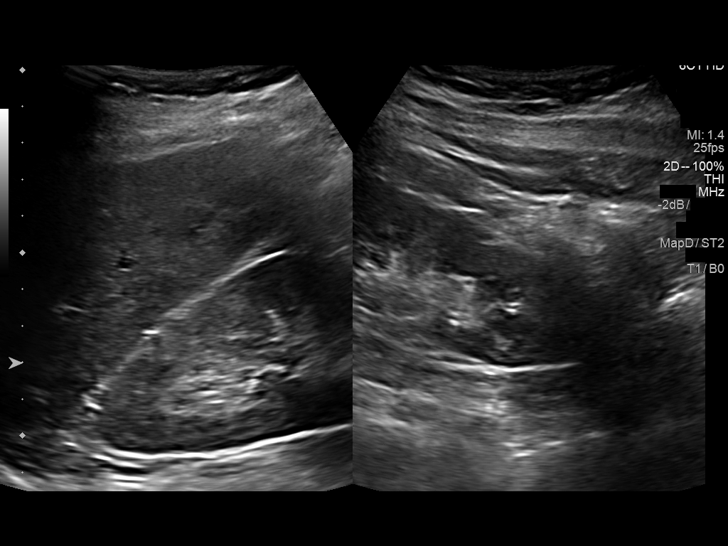
[im 17/36]
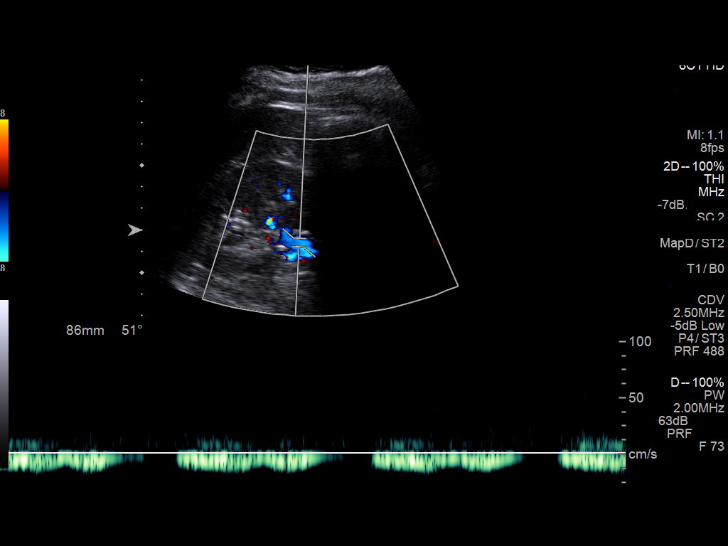
[im 19/36]
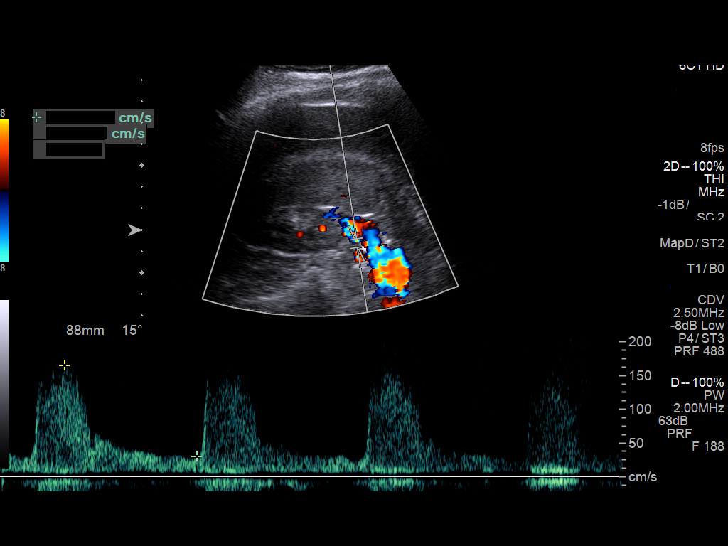
[im 22/36]
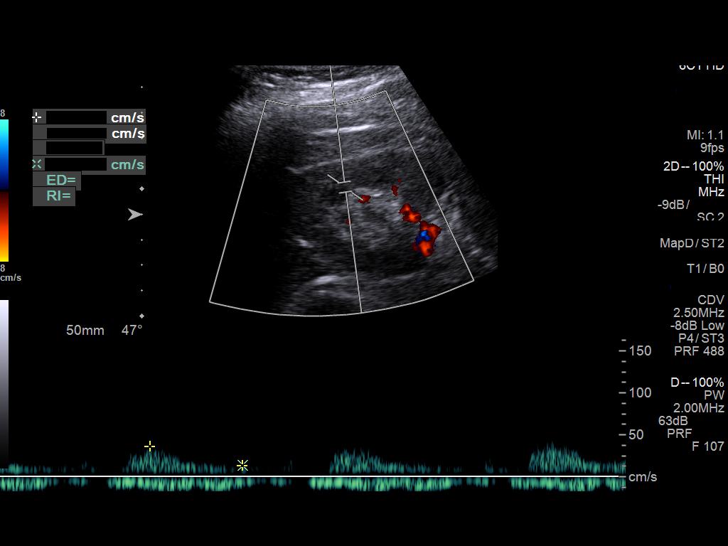
[im 24/36]
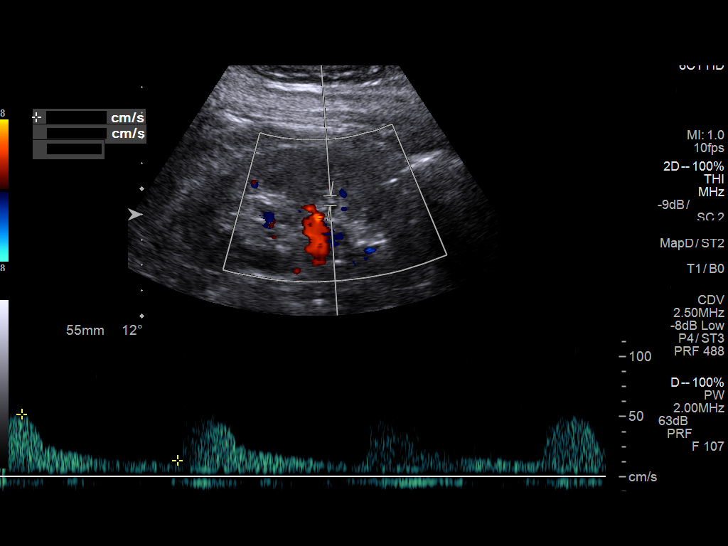
[im 27/36]
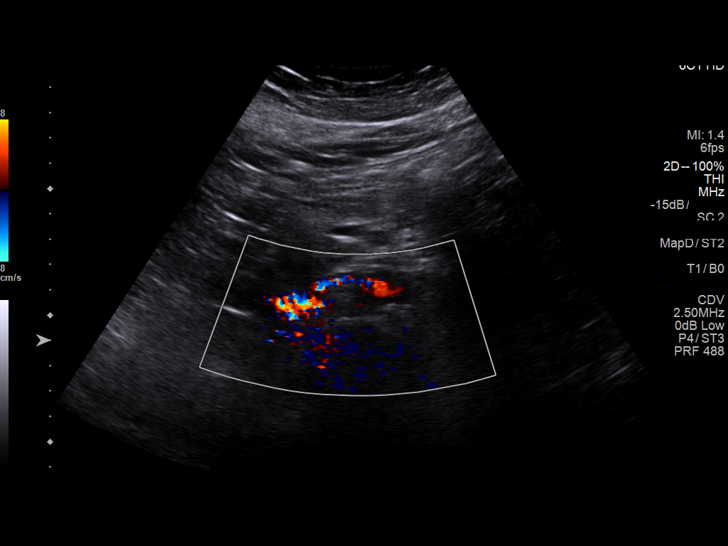
[im 30/36]
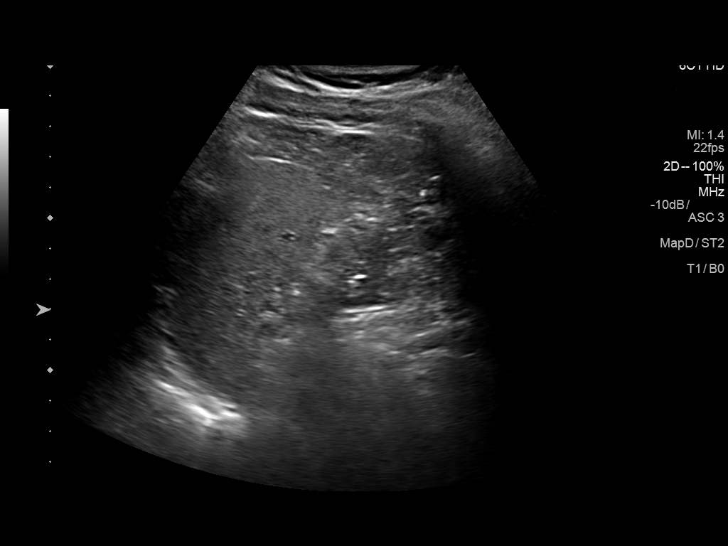
[im 33/36]
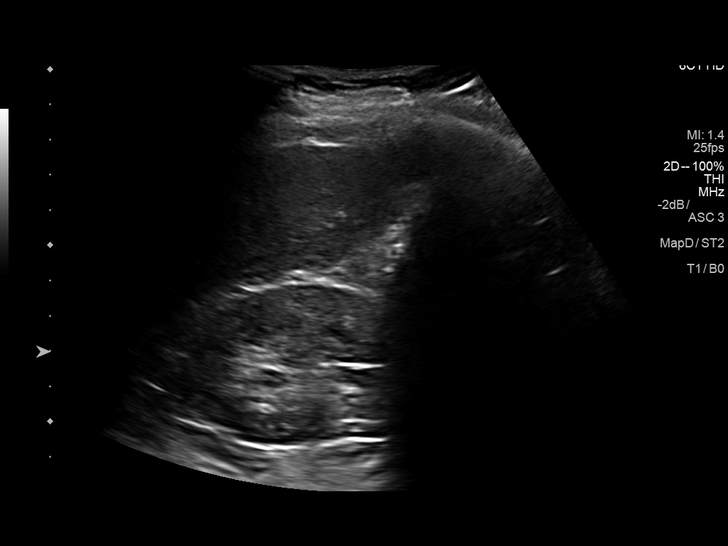
[im 36/36]
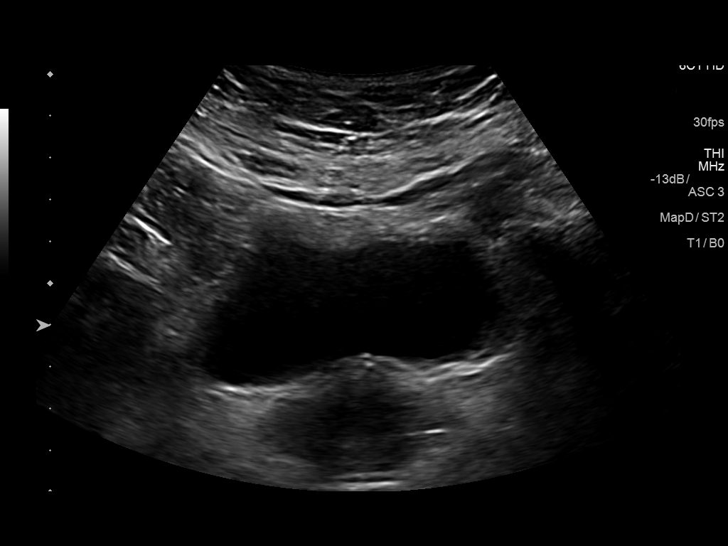

[14 of 25 positions shown; findings below may reference images not displayed]

FINDINGS: Right Kidney:

Length: 11.1 cm. Echogenicity within normal limits. No mass or
hydronephrosis visualized.

Left Kidney:

Length: Not visualized.

Bladder:  Decompressed but within normal limits.

RENAL DUPLEX ULTRASOUND

Right Renal Artery Velocities:

Origin:  267 cm/sec

Mid:  166 cm/sec

Hilum:  152 cm/sec

Interlobar:  59 cm/sec

Arcuate:  37 Cm/sec

Aortic Velocity:  102 Cm/sec

Right Renal-Aortic Ratios:

Origin:

Mid:

Hilum:

Interlobar:

Arcuate:
IMPRESSION: Overall, velocities in the right renal artery have significantly
improved compared with pre renal artery stent measurements. Peak
velocity at the origin has diminished from 310 to 267 cm/sec and the
ratio has dropped from 4.0 to 2.6.

The left kidney was nonvisualized due to atrophy.

## 2019-12-18 ENCOUNTER — Other Ambulatory Visit: Payer: Self-pay | Admitting: Interventional Radiology

## 2019-12-18 DIAGNOSIS — I701 Atherosclerosis of renal artery: Secondary | ICD-10-CM

## 2019-12-27 ENCOUNTER — Other Ambulatory Visit: Payer: Medicare HMO

## 2020-01-11 ENCOUNTER — Ambulatory Visit
Admission: RE | Admit: 2020-01-11 | Discharge: 2020-01-11 | Disposition: A | Discharge status: Home / Self Care | Payer: Medicare HMO | Source: Ambulatory Visit | Attending: Interventional Radiology | Admitting: Interventional Radiology

## 2020-01-11 ENCOUNTER — Encounter: Payer: Self-pay | Admitting: *Deleted

## 2020-01-11 DIAGNOSIS — I701 Atherosclerosis of renal artery: Secondary | ICD-10-CM

## 2020-01-11 HISTORY — PX: IR RADIOLOGIST EVAL & MGMT: IMG5224

## 2020-01-11 NOTE — Progress Notes (Signed)
Patient ID: Leslie Meza, female   DOB: 05/11/1951, 69 y.o.   MRN: [id number]       Chief Complaint:   Renal vascular disease, renal vascular hypertension  Referring Physician(s): [place]  History of Present Illness: Leslie Meza is a 69 y.o. female who is now over 2 years status post right renal artery stenting for uncontrolled hypertension and renal vascular disease.  Over the last year she continues to have well-controlled blood pressure on 3 medications without change in dosing.  She has been compliant with her blood pressure medications and primary care as well as nephrology follow-up.  She remains asymptomatic.  No current chest pain, strokelike symptoms, dizziness, headaches, or other vascular symptoms.  No recent illness or fevers.  Stable weight and appetite.  Past Medical History:  Diagnosis Date  . Diabetes mellitus without complication (HCC)   . Hyperlipidemia   . Hypertension       Allergies: Influenza vaccines  Medications: Prior to Admission medications   Medication Sig Start Date End Date Taking? Authorizing Provider  albuterol (PROVENTIL HFA;VENTOLIN HFA) 108 (90 Base) MCG/ACT inhaler Inhale 1 puff into the lungs every 6 (six) hours as needed for wheezing or shortness of breath.    [provider]  amLODipine (NORVASC) 5 MG tablet Take 10 mg by mouth daily.     [provider]  aspirin 325 MG tablet Take 325 mg by mouth daily.    [provider]  atorvastatin (LIPITOR) 40 MG tablet Take 40 mg by mouth daily.    [provider]  benazepril (LOTENSIN) 40 MG tablet Take 40 mg by mouth daily.    [provider]  docusate sodium (COLACE) 100 MG capsule Take 1 capsule (100 mg total) by mouth 2 (two) times daily. 01/12/15   Derwood Kaplan, MD  glipiZIDE (GLUCOTROL) 5 MG tablet Take by mouth daily before breakfast.    [provider]  loratadine (CLARITIN) 10 MG tablet Take 10 mg by mouth daily as needed for allergies.     [provider]  losartan (COZAAR) 50 MG tablet Take 50 mg by mouth daily.    [provider]  metFORMIN (GLUCOPHAGE) 500 MG tablet Take 500 mg by mouth 2 (two) times daily with a meal.    [provider]  metoprolol (LOPRESSOR) 50 MG tablet Take 50 mg by mouth 2 (two) times daily.    [provider]  polyethylene glycol (MIRALAX / GLYCOLAX) packet Take 17 g by mouth daily as needed.    [provider]  ranitidine (ZANTAC) 150 MG capsule Take 150 mg by mouth 2 (two) times daily.    [provider]  spironolactone (ALDACTONE) 25 MG tablet Take 25 mg by mouth daily.    [provider]     No family history on file.  Social History   Socioeconomic History  . Marital status: Single    Spouse name: Not on file  . Number of children: Not on file  . Years of education: Not on file  . Highest education level: Not on file  Occupational History  . Not on file  Tobacco Use  . Smoking status: Current Every Day Smoker    Packs/day: 0.50  Substance and Sexual Activity  . Alcohol use: No  . Drug use: No  . Sexual activity: Not on file  Other Topics Concern  . Not on file  Social History Narrative  . Not on file   Social Determinants of Health   Financial  Resource Strain:   . Difficulty of Paying Living Expenses: Not on file  Food Insecurity:   . Worried About Charity fundraiser in the Last Year: Not on file  . Ran Out of Food in the Last Year: Not on file  Transportation Needs:   . Lack of Transportation (Medical): Not on file  . Lack of Transportation (Non-Medical): Not on file  Physical Activity:   . Days of Exercise per Week: Not on file  . Minutes of Exercise per Session: Not on file  Stress:   . Feeling of Stress : Not on file  Social Connections:   . Frequency of Communication with Friends and Family: Not on file  . Frequency of Social Gatherings with Friends and Family: Not on file  . Attends Religious  Services: Not on file  . Active Member of Clubs or Organizations: Not on file  . Attends Archivist Meetings: Not on file  . Marital Status: Not on file     Review of Systems: A 12 point ROS discussed and pertinent positives are indicated in the HPI above.  All other systems are negative.  Review of Systems  Vital Signs: BP (!) 136/55 (BP Location: Left Arm)   Pulse 60   Temp 98 F (36.7 C)   SpO2 99%   Physical Exam Constitutional:      Appearance: She is normal weight. She is not toxic-appearing.  Eyes:     General: No scleral icterus.    Conjunctiva/sclera: Conjunctivae normal.  Cardiovascular:     Rate and Rhythm: Normal rate and regular rhythm.     Heart sounds: No murmur.  Pulmonary:     Effort: Pulmonary effort is normal.     Breath sounds: Normal breath sounds.  Abdominal:     General: Bowel sounds are normal.  Neurological:     General: No focal deficit present.     Mental Status: She is alert. Mental status is at baseline.  Psychiatric:        Mood and Affect: Mood normal.     Imaging: US RENAL ARTERY DUPLEX COMPLETE  Result Date: 01/11/2020 CLINICAL DATA:  Follow-up right renal artery ostial stent assisted angioplasty 05/19/2017. EXAM: RENAL DUPLEX DOPPLER ULTRASOUND TECHNIQUE: Duplex and color Doppler ultrasound was utilized to evaluate blood flow in the renal arteries and kidneys. COMPARISON:  12/29/2018 FINDINGS: Right Renal Artery Velocities: Origin:  71 cm/sec Mid:  305 cm/sec Hilum:  129 cm/sec Interlobar:  79 cm/sec Arcuate: 50 cm/sec Aortic Velocity: 143 cm/sec Right Renal-Aortic Ratios: Origin: 0.5 Mid:  2.1 Hilum: 0.9 Interlobar: 0.6 Arcuate: 0.3 Right kidney 11 cm in length. No hydronephrosis or focal lesion identified. Antegrade flow in the renal vein at the hilum. Urinary bladder incompletely distended, unremarkable. Left kidney not evaluated due to previously documented chronic parenchymal atrophy. IMPRESSION: Persistent elevation of peak  systolic velocities in the proximal right renal artery in the region of the stent. Little change from prior exam. Electronically Signed   By: Lucrezia Europe M.D.   On: 01/11/2020 11:18    Labs:  CBC: No results for input(s): WBC, HGB, HCT, PLT in the last 8760 hours.  COAGS: No results for input(s): INR, APTT in the last 8760 hours.  BMP: No results for input(s): NA, K, CL, CO2, GLUCOSE, BUN, CALCIUM, CREATININE, GFRNONAA, GFRAA in the last 8760 hours.  Invalid input(s): CMP  LIVER FUNCTION TESTS: No results for input(s): BILITOT, AST, ALT, ALKPHOS, PROT, ALBUMIN in the last 8760 hours.  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  Blood pressure remains well controlled on 3 medications without change in dosage.  She also remains on full-strength aspirin daily for antiplatelet regimen.  Stable creatinine at 1.1 (01/05/2020).  She is closely followed by her primary care physician as well as nephrology.  Renal duplex today demonstrates a stable mild right renal artery velocity elevation without significant change.  Stability suggest that this may be related to technical factors and the renal vascular stent rather than a true narrowing.  Clinically she does not have any signs of recurrent renal artery stenosis.  Plan: Annual renal duplex surveillance and outpatient follow-up.  Thank you for this interesting consult.  I greatly enjoyed meeting Leslie Meza and look forward to participating in their care.  A copy of this report was sent to the requesting provider on this date.  Electronically Signed: Berdine Dance 01/11/2020, 12:48 PM   I spent a total of    25 Minutes in face to face in clinical consultation, greater than 50% of which was counseling/coordinating care for this patient with renovascular hypertension

## 2020-02-08 IMAGING — US US RENAL ARTERY STENOSIS
1 series · 14 of 25 positions shown · non-contrast
Comparison: 12/29/2018

CLINICAL DATA: Follow-up right renal artery ostial stent assisted
angioplasty 05/19/2017.

EXAM:
RENAL DUPLEX DOPPLER ULTRASOUND
TECHNIQUE: Duplex and color Doppler ultrasound was utilized to evaluate blood
flow in the renal arteries and kidneys.

[Series 1: us renal artery stenosis · 0.23mm/px · 14 of 40 slices shown]
[im 1/40]
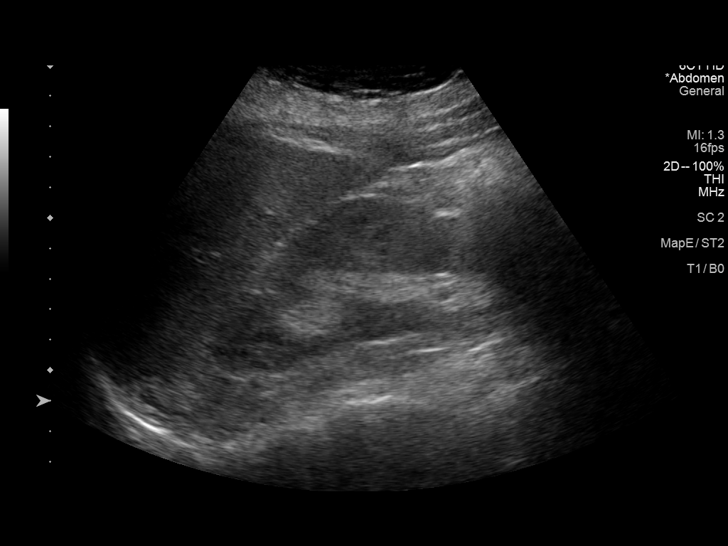
[im 4/40]
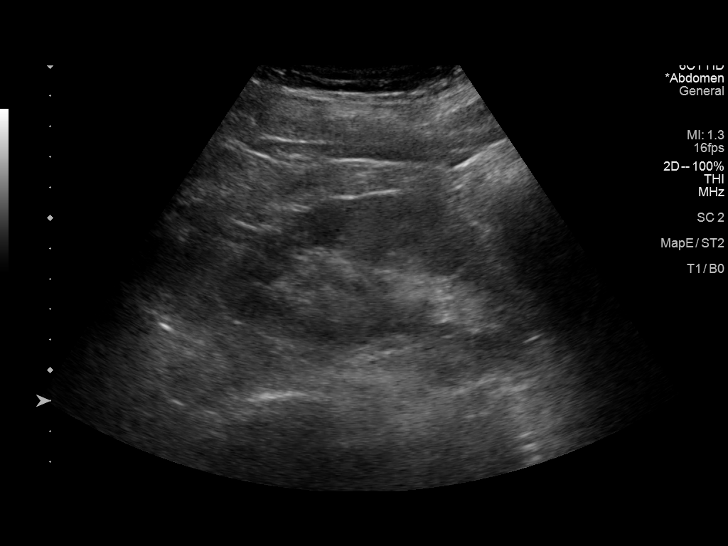
[im 7/40]
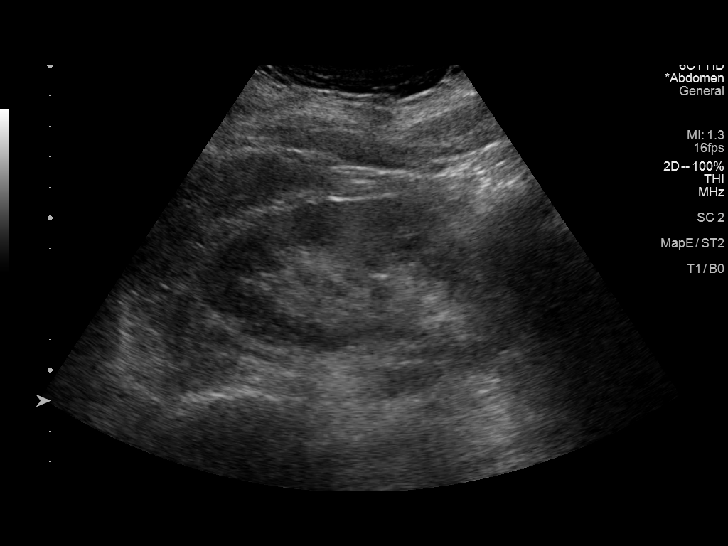
[im 10/40]
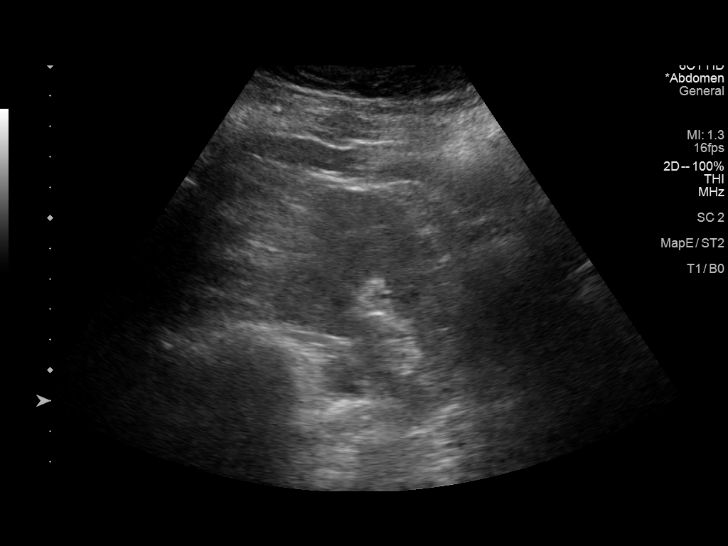
[im 14/40]
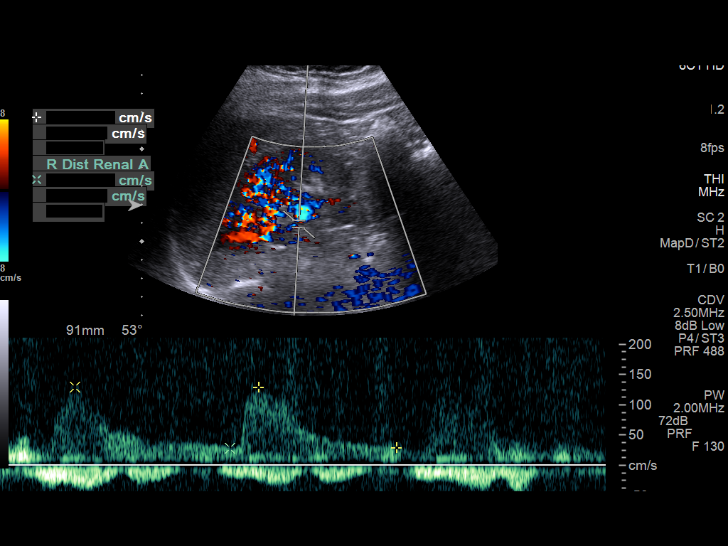
[im 15/40]
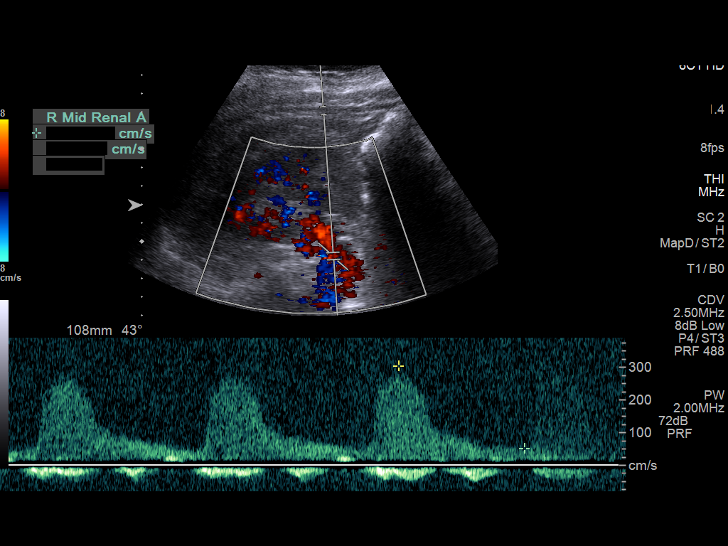
[im 18/40]
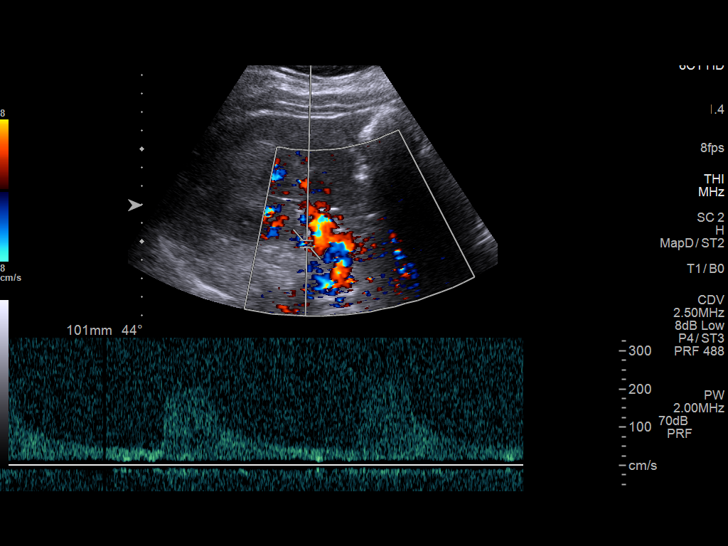
[im 22/40]
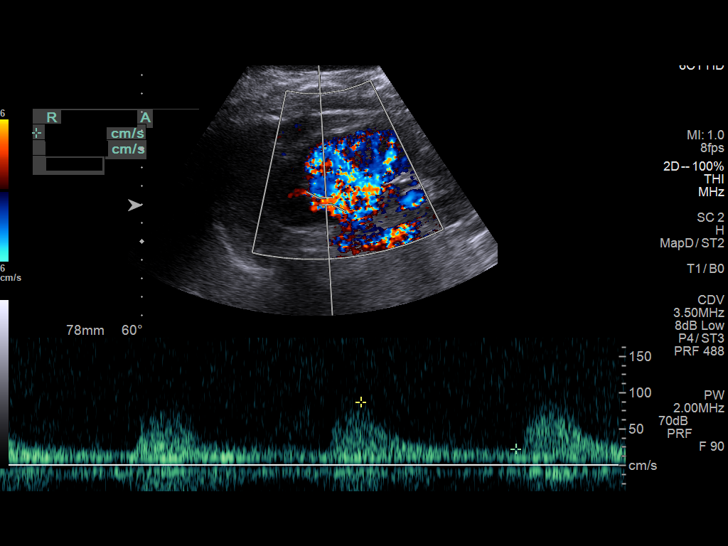
[im 25/40]
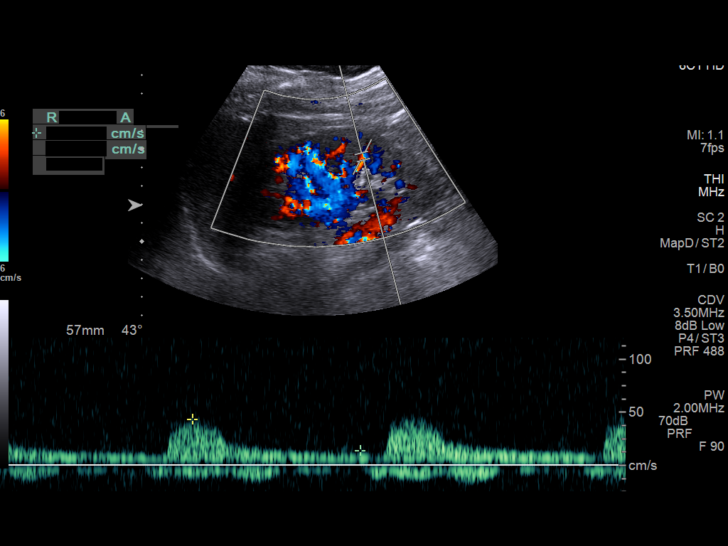
[im 27/40]
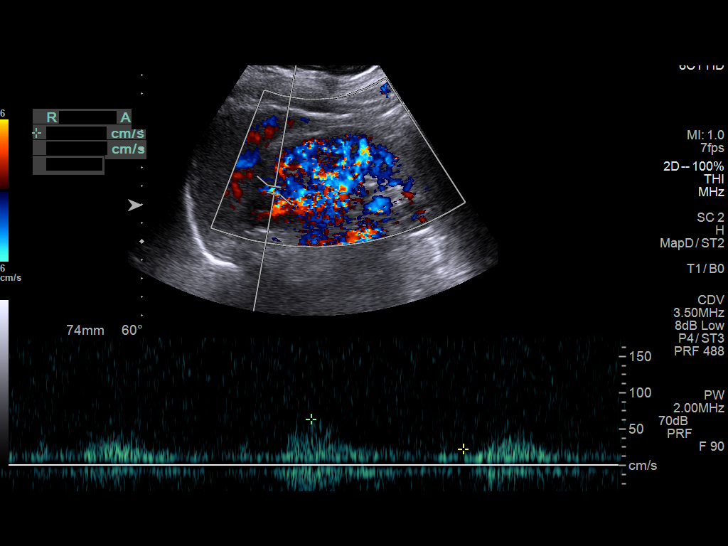
[im 30/40]
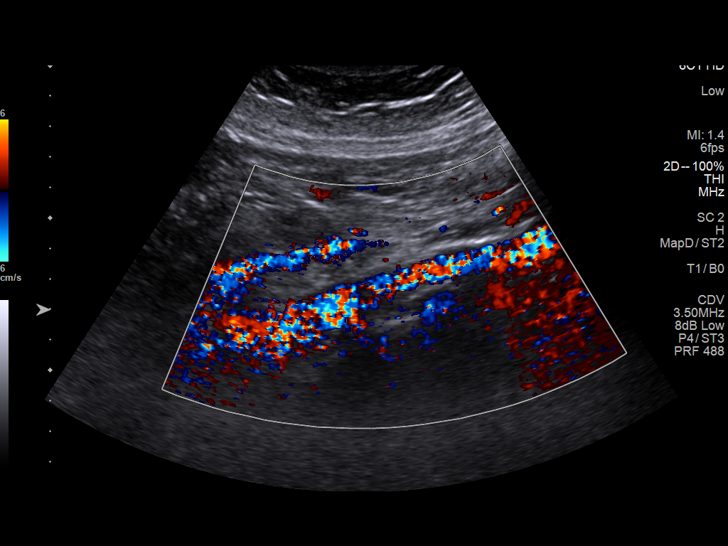
[im 33/40]
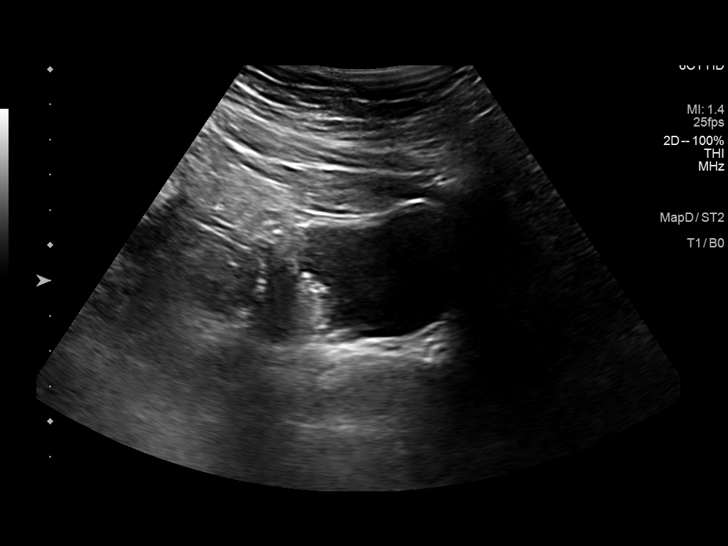
[im 36/40]
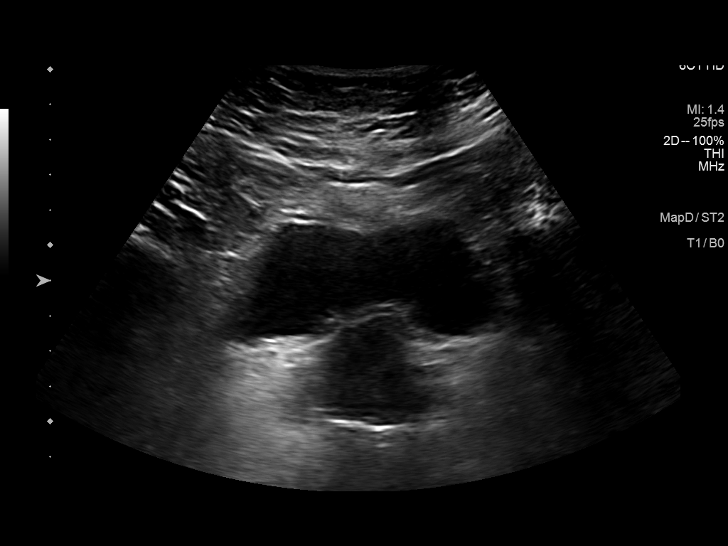
[im 40/40]
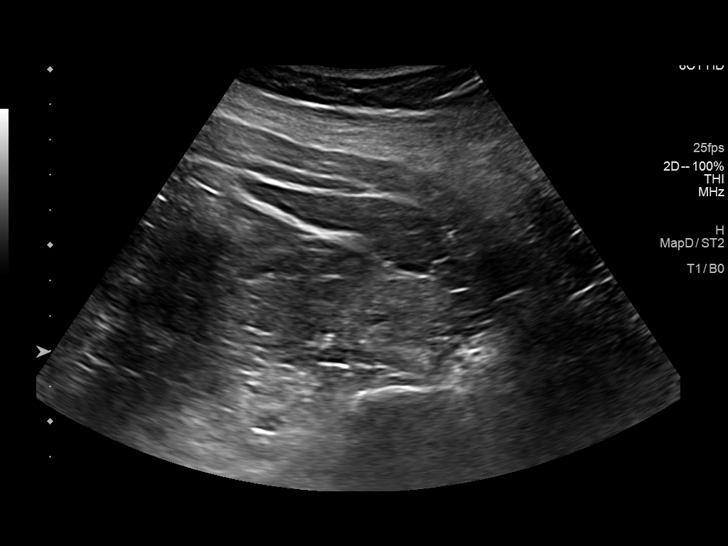

[14 of 25 positions shown; findings below may reference images not displayed]

FINDINGS: Right Renal Artery Velocities:

Origin:  71 cm/sec

Mid:  305 cm/sec

Hilum:  129 cm/sec

Interlobar:  79 cm/sec

Arcuate: 50 cm/sec

Aortic Velocity: 143 cm/sec

Right Renal-Aortic Ratios:

Origin:

Mid:

Hilum:

Interlobar:

Arcuate:

Right kidney 11 cm in length. No hydronephrosis or focal lesion
identified. Antegrade flow in the renal vein at the hilum. Urinary
bladder incompletely distended, unremarkable. Left kidney not
evaluated due to previously documented chronic parenchymal atrophy.
IMPRESSION: Persistent elevation of peak systolic velocities in the proximal
right renal artery in the region of the stent. Little change from
prior exam.

## 2020-12-10 ENCOUNTER — Other Ambulatory Visit: Payer: Self-pay | Admitting: Interventional Radiology

## 2020-12-10 DIAGNOSIS — I701 Atherosclerosis of renal artery: Secondary | ICD-10-CM

## 2020-12-31 ENCOUNTER — Ambulatory Visit: Payer: Medicare HMO

## 2021-01-01 ENCOUNTER — Other Ambulatory Visit: Payer: Self-pay | Admitting: Interventional Radiology

## 2021-01-01 ENCOUNTER — Ambulatory Visit
Admission: RE | Admit: 2021-01-01 | Discharge: 2021-01-01 | Disposition: A | Discharge status: Home / Self Care | Payer: Medicare HMO | Source: Ambulatory Visit | Attending: Interventional Radiology | Admitting: Interventional Radiology

## 2021-01-01 ENCOUNTER — Encounter: Payer: Self-pay | Admitting: *Deleted

## 2021-01-01 ENCOUNTER — Other Ambulatory Visit: Payer: Self-pay

## 2021-01-01 DIAGNOSIS — I701 Atherosclerosis of renal artery: Secondary | ICD-10-CM

## 2021-01-01 HISTORY — PX: IR RADIOLOGIST EVAL & MGMT: IMG5224
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
## 2021-01-15 ENCOUNTER — Other Ambulatory Visit: Payer: Self-pay | Admitting: Interventional Radiology

## 2021-01-15 ENCOUNTER — Ambulatory Visit
Admission: RE | Admit: 2021-01-15 | Discharge: 2021-01-15 | Disposition: A | Discharge status: Home / Self Care | Payer: Medicare HMO | Source: Ambulatory Visit | Attending: Interventional Radiology | Admitting: Interventional Radiology

## 2021-01-15 ENCOUNTER — Other Ambulatory Visit: Payer: Self-pay

## 2021-01-15 DIAGNOSIS — I701 Atherosclerosis of renal artery: Secondary | ICD-10-CM

## 2021-01-16 ENCOUNTER — Ambulatory Visit
Admission: RE | Admit: 2021-01-16 | Discharge: 2021-01-16 | Disposition: A | Discharge status: Home / Self Care | Payer: Medicare HMO | Source: Ambulatory Visit | Attending: Interventional Radiology | Admitting: Interventional Radiology

## 2021-01-16 DIAGNOSIS — I701 Atherosclerosis of renal artery: Secondary | ICD-10-CM

## 2021-01-16 HISTORY — PX: IR RADIOLOGIST EVAL & MGMT: IMG5224

## 2021-01-16 NOTE — Progress Notes (Signed)
Patient ID: Leslie Meza, female   DOB: 09-30-1951, 70 y.o.   MRN: [id number]       Chief Complaint:  Renovascular hypertension  Referring Physician(s): Nwobu  History of Present Illness: Leslie Meza is a 70 y.o. female who is 3 years status post right renal artery stenting for uncontrolled hypertension and renal vascular disease.  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  She essentially has 1 kidney.  She continues to have well-controlled blood pressure on 3 medications including amlodipine, losartan, and Aldactone.  She reports no change in dosing.  She also remains on 325 mg aspirin daily for antiplatelet therapy.  Creatinine has been stable at 1.14 on 01/06/2021 (obtained from care everywhere).  Overall she is doing very well.  No current complaints including chest pain, dizziness, headaches, or other vascular symptoms.  No recent illness or fevers.  She was able to return for the renal duplex exam which is essentially unchanged from last year.  Although I think the clinical factors of stable creatinine, stable blood pressure, and unchanged blood pressure medications are more indicative that the right renal stent remains patent.  Past Medical History:  Diagnosis Date  . Diabetes mellitus without complication (HCC)   . Hyperlipidemia   . Hypertension       Allergies: Influenza vaccines  Medications: Prior to Admission medications   Medication Sig Start Date End Date Taking? Authorizing Provider  albuterol (PROVENTIL HFA;VENTOLIN HFA) 108 (90 Base) MCG/ACT inhaler Inhale 1 puff into the lungs every 6 (six) hours as needed for wheezing or shortness of breath.    [provider]  amLODipine (NORVASC) 5 MG tablet Take 10 mg by mouth daily.     [provider]  aspirin 325 MG tablet Take 325 mg by mouth daily.    [provider]  atorvastatin (LIPITOR) 40 MG tablet Take 40 mg by mouth daily.    [provider]  benazepril  (LOTENSIN) 40 MG tablet Take 40 mg by mouth daily.    [provider]  docusate sodium (COLACE) 100 MG capsule Take 1 capsule (100 mg total) by mouth 2 (two) times daily. 01/12/15   Derwood Kaplan, MD  glipiZIDE (GLUCOTROL) 5 MG tablet Take by mouth daily before breakfast.    [provider]  loratadine (CLARITIN) 10 MG tablet Take 10 mg by mouth daily as needed for allergies.    [provider]  losartan (COZAAR) 50 MG tablet Take 50 mg by mouth daily.    [provider]  metFORMIN (GLUCOPHAGE) 500 MG tablet Take 500 mg by mouth 2 (two) times daily with a meal.    [provider]  metoprolol (LOPRESSOR) 50 MG tablet Take 50 mg by mouth 2 (two) times daily.    [provider]  polyethylene glycol (MIRALAX / GLYCOLAX) packet Take 17 g by mouth daily as needed.    [provider]  ranitidine (ZANTAC) 150 MG capsule Take 150 mg by mouth 2 (two) times daily.    [provider]  spironolactone (ALDACTONE) 25 MG tablet Take 25 mg by mouth daily.    [provider]     No family history on file.  Social History   Socioeconomic History  . Marital status: Single    Spouse name: Not on file  . Number of children: Not on file  . Years of education: Not on file  . Highest education level: Not on file  Occupational History  . Not on  file  Tobacco Use  . Smoking status: Current Every Day Smoker    Packs/day: 0.50  . Smokeless tobacco: Not on file  Substance and Sexual Activity  . Alcohol use: No  . Drug use: No  . Sexual activity: Not on file  Other Topics Concern  . Not on file  Social History Narrative  . Not on file   Social Determinants of Health   Financial Resource Strain: Not on file  Food Insecurity: Not on file  Transportation Needs: Not on file  Physical Activity: Not on file  Stress: Not on file  Social Connections: Not on file    Review of Systems  Review of Systems: A 12 point ROS  discussed and pertinent positives are indicated in the HPI above.  All other systems are negative.  Physical Exam No direct physical exam was performed, telephone health visit only today because of Covid pandemic   Vital Signs: There were no vitals taken for this visit.  Imaging: US RENAL ARTERY DUPLEX LIMITED  Result Date: 01/15/2021 CLINICAL DATA:  Renal vascular disease, previous right renal artery stent placement 05/19/2017, chronic left renal artery occlusion EXAM: RENAL/URINARY TRACT ULTRASOUND RENAL DUPLEX DOPPLER ULTRASOUND COMPARISON:  01/11/2020, 12/29/2018 FINDINGS: Right Kidney: Length: 11.3 cm. Echogenicity within normal limits. No mass or hydronephrosis visualized. Left Kidney: Length: 5.9 cm. Increased echogenicity and chronic atrophy noted. No obstruction or hydronephrosis. Bladder:  No gross abnormality. 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 When compared to the prior renal duplex studies, the right renal artery origin peak systolic velocity has always been elevated (greater than 300). Therefore, this is presumed to be artifactual related to the metallic stent. Review of her epic chart in care everywhere confirms a stable creatinine at 1.14 on 01/06/2021, and unchanged dating back to 2020. There has also been no change in her blood pressure medications by her primary care physician. These 2 clinical factors support that there is no significant renal artery stenosis. IMPRESSION: Limited right renal duplex exam with persistent elevation of the right renal artery origin velocity continuing to be greater than 300. Right renal artery origin  ratio at 2.8. Suspect the velocity measurement is artifactual related to the intravascular stent. See above comment and clinical correlation from epic chart care everywhere. Electronically Signed   By: Judie Petit.  Anagabriela Jokerst M.D.   On: 01/15/2021 11:20   IR Radiologist Eval & Mgmt  Result Date: 01/01/2021 Please refer to notes tab for details about interventional procedure. (Op Note)   Labs:  CBC: No results for input(s): WBC, HGB, HCT, PLT in the last 8760 hours.  COAGS: No results for input(s): INR, APTT in the last 8760 hours.  BMP: No results for input(s): NA, K, CL, CO2, GLUCOSE, BUN, CALCIUM, CREATININE, GFRNONAA, GFRAA in the last 8760 hours.  Invalid input(s): CMP  LIVER FUNCTION TESTS: No results for input(s): BILITOT, AST, ALT, ALKPHOS, PROT, ALBUMIN in the last 8760 hours.  TUMOR MARKERS: No results for input(s): AFPTM, CEA, CA199, CHROMGRNA in the last 8760 hours.  Assessment and Plan:  3 years status post right renal artery stenting for right renal artery stenosis in the setting of a solitary  kidney.  Overall stable blood pressure management and well controlled on 3 medications without dose change.  Stable creatinine at 1.14 on 01/06/2021.  Renal duplex is stable compared to 1 year ago, performed yesterday.  Plan: Continue annual surveillance with renal duplex and clinical assessment, including blood pressure management and creatinine surveillance..   Electronically Signed: Berdine Dance 01/16/2021, 8:42 AM   I spent a total of    25 Minutes in remote  clinical consultation, greater than 50% of which was counseling/coordinating care for this patient with a right renal artery stent and solitary kidney.    Visit type: Audio only (telephone). Audio (no video) only due to patient's lack of internet/smartphone capability. Alternative for in-person consultation at [facility], [address] [place], [place], [place]. This visit type was conducted due to national recommendations  for restrictions regarding the COVID-19 Pandemic (e.g. social distancing).  This format is felt to be most appropriate for this patient at this time.  All issues noted in this document were discussed and addressed.

## 2022-01-21 ENCOUNTER — Other Ambulatory Visit: Payer: Self-pay | Admitting: Interventional Radiology

## 2022-01-21 DIAGNOSIS — I701 Atherosclerosis of renal artery: Secondary | ICD-10-CM

## 2022-02-16 ENCOUNTER — Encounter: Payer: Self-pay | Admitting: *Deleted

## 2022-02-16 ENCOUNTER — Ambulatory Visit
Admission: RE | Admit: 2022-02-16 | Discharge: 2022-02-16 | Disposition: A | Discharge status: Home / Self Care | Payer: Medicare HMO | Source: Ambulatory Visit | Attending: Interventional Radiology | Admitting: Interventional Radiology

## 2022-02-16 DIAGNOSIS — I701 Atherosclerosis of renal artery: Secondary | ICD-10-CM

## 2022-02-16 HISTORY — PX: IR RADIOLOGIST EVAL & MGMT: IMG5224

## 2022-02-16 NOTE — Progress Notes (Signed)
Patient ID: Leslie Meza, female   DOB: 1951-04-19, 71 y.o.   MRN: [id number] ?    ? ? ?Chief Complaint: ? ?Renovascular hypertension, diabetes ? ? ?Referring Physician(s): ?Nwobu ? ?History of Present Illness: ?Leslie Meza is a 71 y.o. female with a history of known diabetes, hyperlipidemia, hypertension, and smoking.  She is now 4 years status post right renal artery stent for uncontrolled hypertension and chronic renal vascular disease.  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 ? ?Review of her epic chart was performed.  She continues to have well-controlled blood pressure on 3 medications including amlodipine, losartan, and Aldactone.  No changes in dosing.  She remains on 325 mg aspirin daily for antiplatelet therapy. ? ?Creatinine remains stable at 1.1 in February 2023 (obtained in care everywhere.  Overall she continues to do very well.  No current complaints.  Negative for chest pain, dizziness, headaches or other vascular symptoms.  No recent illness or fevers. ? ?Renal duplex today is essentially stable from previous years. ? ?Again, I think the clinical factors of a stable creatinine and blood pressure as well as unchanged blood pressure medications are more indicative that the right renal artery stent remains patent. ? ?Past Medical History:  ?Diagnosis Date  ? Diabetes mellitus without complication (HCC)   ? Hyperlipidemia   ? Hypertension   ? ? ? ? ?Allergies: ?Influenza vaccines ? ?Medications: ?Prior to Admission medications   ?Medication Sig Start Date End Date Taking? Authorizing Provider  ?albuterol (PROVENTIL HFA;VENTOLIN HFA) 108 (90 Base) MCG/ACT inhaler Inhale 1 puff into the lungs every 6 (six) hours as needed for wheezing or shortness of breath.    [provider]  ?amLODipine (NORVASC) 5 MG tablet Take 10 mg by mouth daily.     [provider]  ?aspirin 325 MG tablet Take 325 mg by mouth daily.    [provider]  ?atorvastatin  (LIPITOR) 40 MG tablet Take 40 mg by mouth daily.    [provider]  ?benazepril (LOTENSIN) 40 MG tablet Take 40 mg by mouth daily.    [provider]  ?docusate sodium (COLACE) 100 MG capsule Take 1 capsule (100 mg total) by mouth 2 (two) times daily. 01/12/15   Derwood Kaplan, MD  ?glipiZIDE (GLUCOTROL) 5 MG tablet Take by mouth daily before breakfast.    [provider]  ?loratadine (CLARITIN) 10 MG tablet Take 10 mg by mouth daily as needed for allergies.    [provider]  ?losartan (COZAAR) 50 MG tablet Take 50 mg by mouth daily.    [provider]  ?metFORMIN (GLUCOPHAGE) 500 MG tablet Take 500 mg by mouth 2 (two) times daily with a meal.    [provider]  ?metoprolol (LOPRESSOR) 50 MG tablet Take 50 mg by mouth 2 (two) times daily.    [provider]  ?polyethylene glycol (MIRALAX / GLYCOLAX) packet Take 17 g by mouth daily as needed.    [provider]  ?ranitidine (ZANTAC) 150 MG capsule Take 150 mg by mouth 2 (two) times daily.    [provider]  ?spironolactone (ALDACTONE) 25 MG tablet Take 25 mg by mouth daily.    [provider]  ?  ? ?No family history on file. ? ?Social History  ? ?Socioeconomic History  ? Marital status: Single  ?  Spouse name: Not on file  ? Number of children: Not on file  ? Years of education: Not on file  ?  Highest education level: Not on file  ?Occupational History  ? Not on file  ?Tobacco Use  ? Smoking status: Every Day  ?  Packs/day: 0.50  ?  Types: Cigarettes  ? Smokeless tobacco: Not on file  ?Substance and Sexual Activity  ? Alcohol use: No  ? Drug use: No  ? Sexual activity: Not on file  ?Other Topics Concern  ? Not on file  ?Social History Narrative  ? Not on file  ? ?Social Determinants of Health  ? ?Financial Resource Strain: Not on file  ?Food Insecurity: Not on file  ?Transportation Needs: Not on file  ?Physical Activity: Not on file  ?Stress: Not on file  ?Social  Connections: Not on file  ? ? ? ?Review of Systems: A 12 point ROS discussed and pertinent positives are indicated in the HPI above.  All other systems are negative. ? ?Review of Systems ? ?Vital Signs: ?BP (!) 130/59 (BP Location: Left Arm)   Pulse (!) 59   SpO2 99%  ? ?Physical Exam ?Constitutional:   ?   Appearance: She is normal weight. She is not ill-appearing or diaphoretic.  ?Eyes:  ?   General: No scleral icterus. ?   Conjunctiva/sclera: Conjunctivae normal.  ?Cardiovascular:  ?   Rate and Rhythm: Normal rate and regular rhythm.  ?   Heart sounds: No murmur heard. ?Pulmonary:  ?   Effort: Pulmonary effort is normal.  ?   Breath sounds: Normal breath sounds.  ?Abdominal:  ?   General: Abdomen is flat. Bowel sounds are normal. There is no distension.  ?   Palpations: Abdomen is soft.  ?   Tenderness: There is no abdominal tenderness.  ?Skin: ?   Coloration: Skin is not jaundiced.  ?Neurological:  ?   General: No focal deficit present.  ?   Mental Status: Mental status is at baseline.  ?Psychiatric:     ?   Mood and Affect: Mood normal.     ?   Thought Content: Thought content normal.  ? ? ? ?  ? ?Imaging: ?US RENAL ARTERY DUPLEX COMPLETE ? ?Result Date: 02/16/2022 ?CLINICAL DATA:  History of renal vascular disease, previous right renal artery stent placement 05/19/2017. Chronic left renal artery occlusion. EXAM: RENAL/URINARY TRACT ULTRASOUND RENAL DUPLEX DOPPLER ULTRASOUND COMPARISON:  01/15/2021 FINDINGS: Right Kidney: Length: 10 cm. Echogenicity within normal limits. No mass or hydronephrosis visualized. Left Kidney: Known chronic left renal atrophy. Bladder:  Normal appearance by ultrasound RENAL DUPLEX ULTRASOUND Right Renal Artery Velocities: Origin:  95 cm/sec Mid:  284 cm/sec Hilum:  206 cm/sec Interlobar:  93 cm/sec Arcuate:  53 cm/sec Aortic Velocity:  118 cm/sec Right Renal-Aortic Ratios: Origin: 0.8 Mid:  2.4 Hilum: 1.7 Interlobar: 0.8 Arcuate: 0.4 Left renal duplex not performed because of known  chronic left renal artery occlusion and left renal atrophy. Compared with the prior studies, the right renal artery peak systolic velocity remains elevated but similar to the prior studies. Suspect a component of this is related to artifact from the metallic stent at the right renal origin. Again, review of her epic chart confirms a stable creatinine at 1.1 in February of 2023. There has also been no change in her blood pressure management by nephrology. Blood pressure has been well controlled. IMPRESSION: Stable limited right renal artery duplex with persistent elevation of the right renal artery as before. Right renal artery ratio at 2.4, previously 2.8. Stability of her creatinine and maintained blood pressure control supports that the right renal artery  stent remains patent at 4 years. Electronically Signed   By: Judie Petit.  Xzavier Swinger M.D.   On: 02/16/2022 09:10   ? ?Labs: ? ?Creatinine from February 2023 in Care Everywhere remains stable at 1.1 ? ? ?Assessment and Plan: ? ?4 years status post right renal artery stent placement for renovascular hypertension in the setting of a solitary kidney.  Known chronic left renal artery occlusion.  Overall stable blood pressure management and well controlled on 3 medications without interval dose change.  Stable creatinine at 1.1 from February 2023.  Renal duplex also unchanged from 1 year ago performed today. ? ?Plan: Continue annual surveillance with a renal duplex and clinical assessment including blood pressure management and creatinine surveillance.  Again, she was encouraged to stop smoking as well as continue with a low-salt diet and daily water intake. ? ?  ? ?Electronically Signed: ?Berdine Dance ?02/16/2022, 9:25 AM ? ? ?I spent a total of    40 Minutes in face to face in clinical consultation, greater than 50% of which was counseling/coordinating care for this patient with chronic renal vascular disease ? ?

## 2022-03-16 IMAGING — US US RENAL ARTERY STENOSIS
1 series · 13 of 25 positions shown · non-contrast
Comparison: 01/15/2021

CLINICAL DATA: History of renal vascular disease, previous right
renal artery stent placement 05/19/2017. Chronic left renal artery
occlusion.

EXAM:
RENAL/URINARY TRACT ULTRASOUND
RENAL DUPLEX DOPPLER ULTRASOUND

[Series 1: us renal artery stenosis · 13 of 37 slices shown]
[im 1/37]
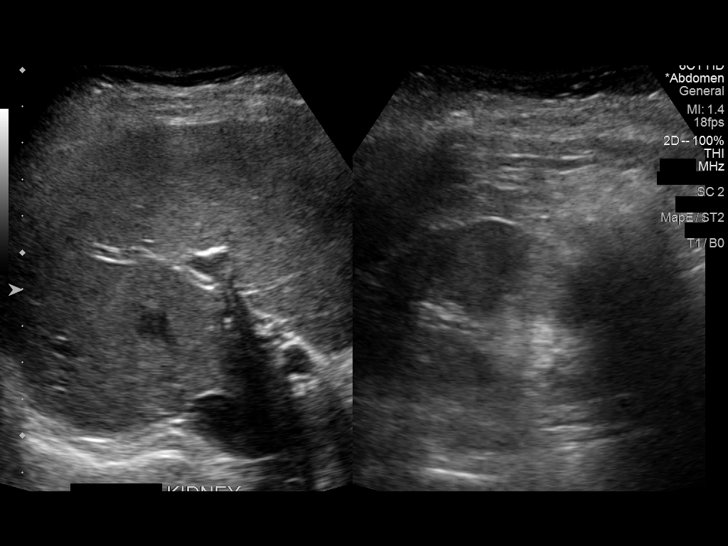
[im 4/37]
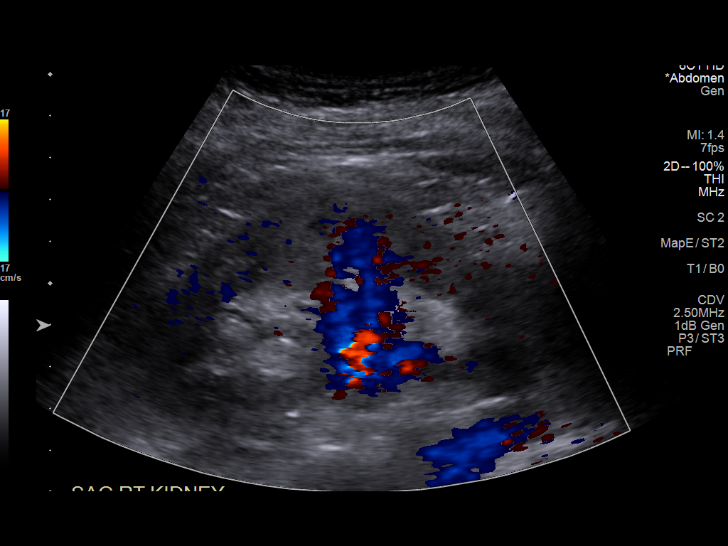
[im 7/37]
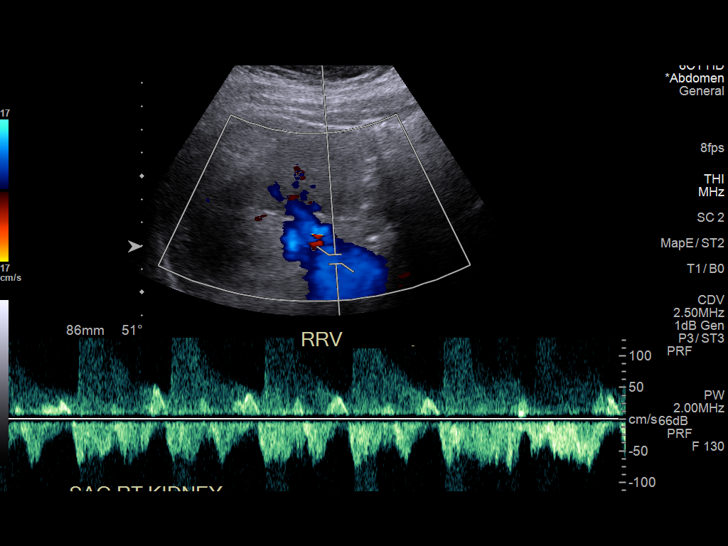
[im 10/37]
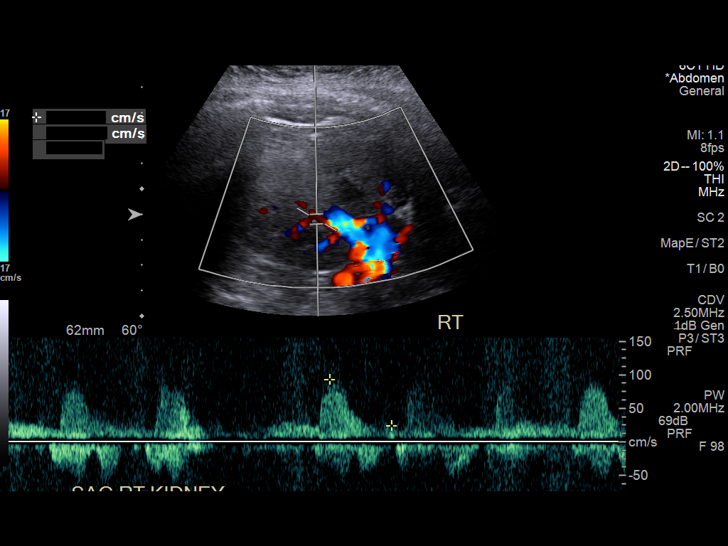
[im 13/37]
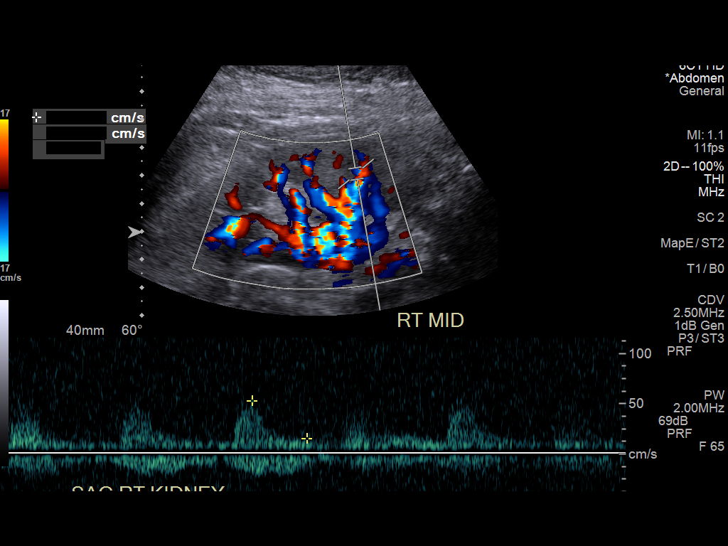
[im 16/37]
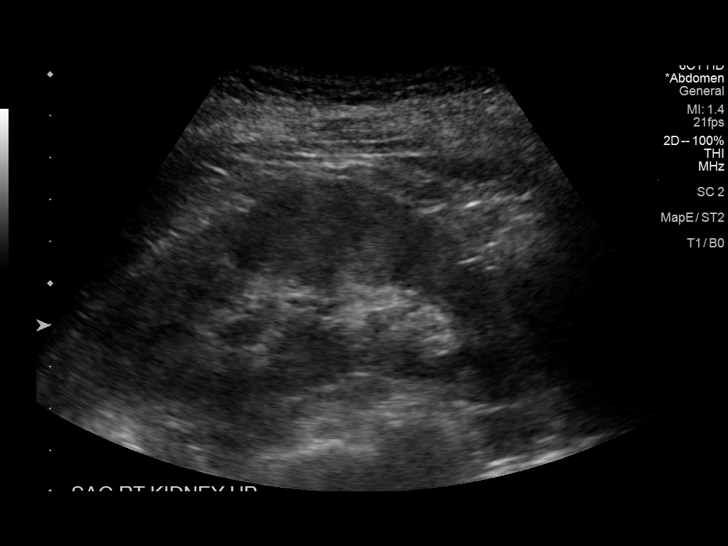
[im 19/37]
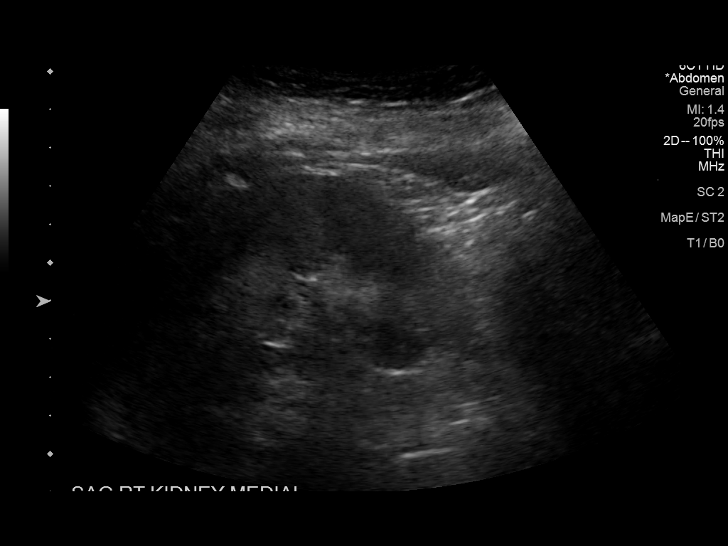
[im 22/37]
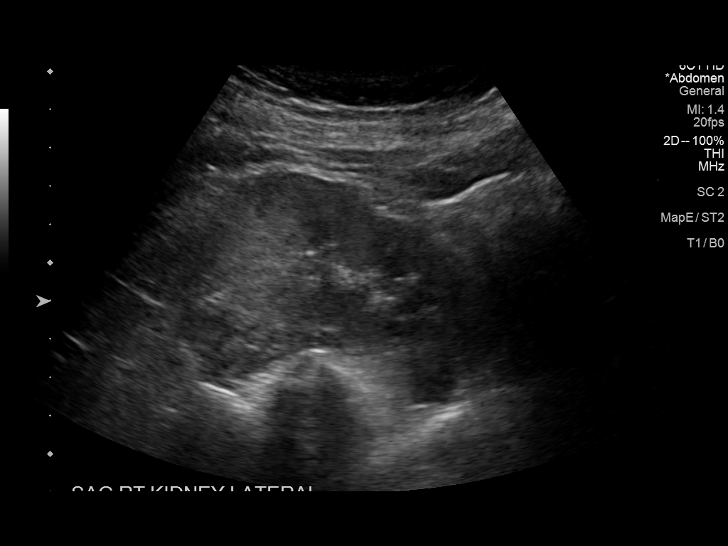
[im 25/37]
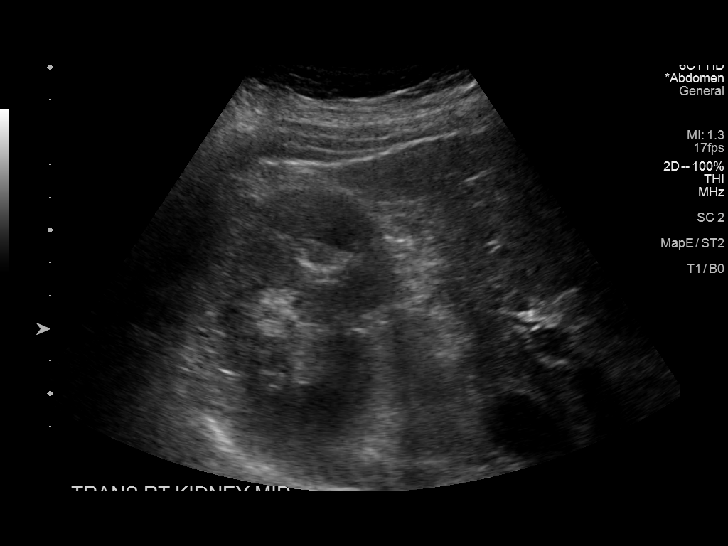
[im 28/37]
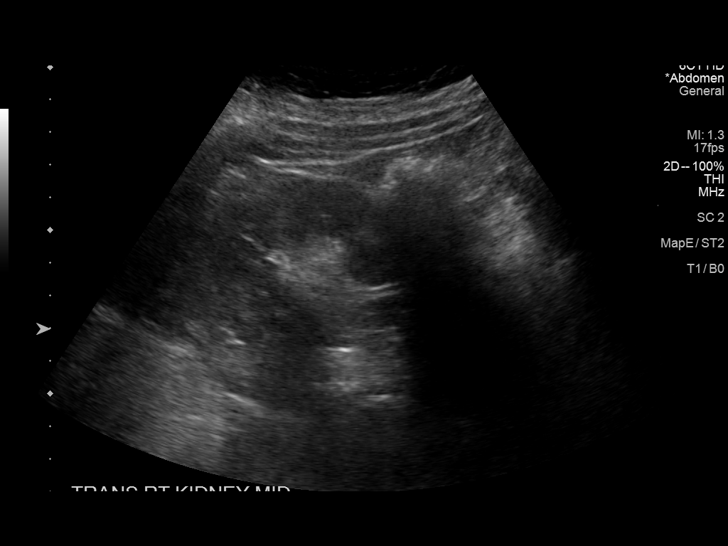
[im 31/37]
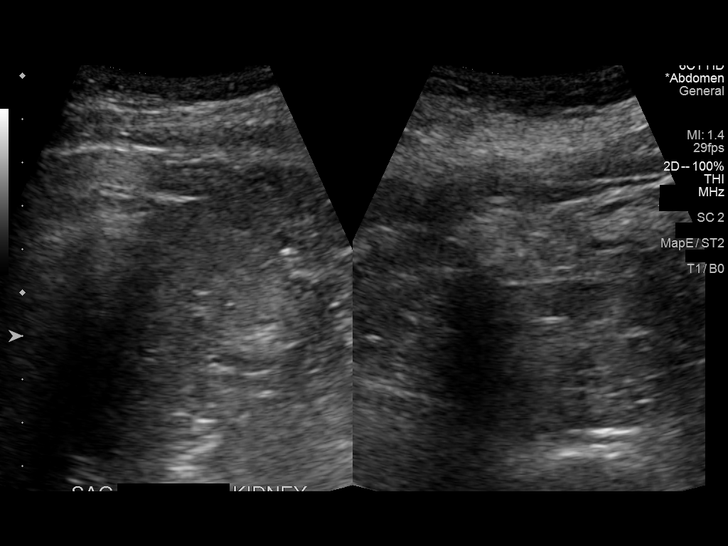
[im 34/37]
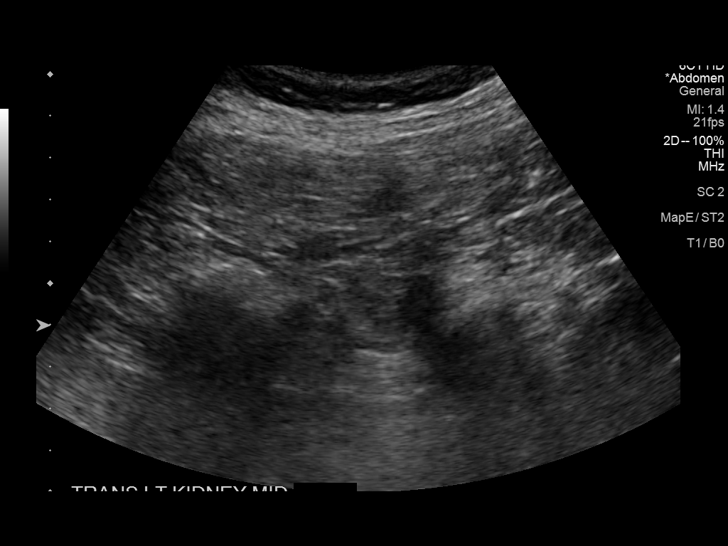
[im 37/37]
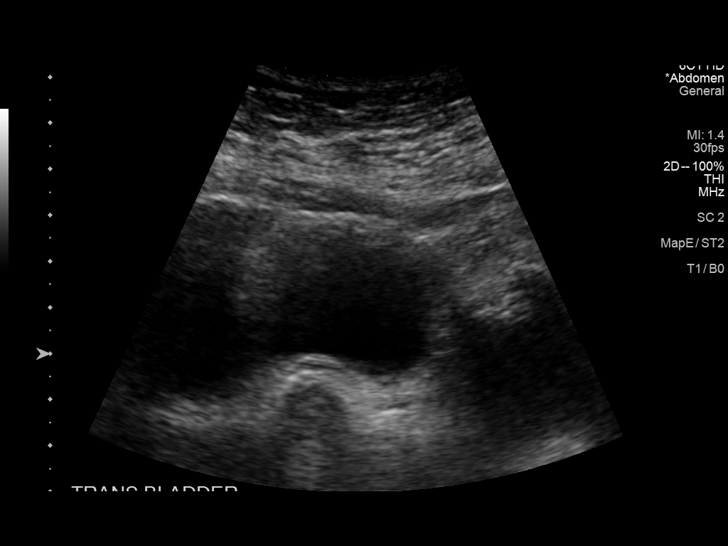

[13 of 25 positions shown; findings below may reference images not displayed]

FINDINGS: Right Kidney:

Length: 10 cm. Echogenicity within normal limits. No mass or
hydronephrosis visualized.

Left Kidney:

Known chronic left renal atrophy.

Bladder:  Normal appearance by ultrasound

RENAL DUPLEX ULTRASOUND

Right Renal Artery Velocities:

Origin:  95 cm/sec

Mid:  284 cm/sec

Hilum:  206 cm/sec

Interlobar:  93 cm/sec

Arcuate:  53 cm/sec

Aortic Velocity:  118 cm/sec

Right Renal-Aortic Ratios:

Origin:

Mid:

Hilum:

Interlobar:

Arcuate:

Left renal duplex not performed because of known chronic left renal
artery occlusion and left renal atrophy.

Compared with the prior studies, the right renal artery peak
systolic velocity remains elevated but similar to the prior studies.
Suspect a component of this is related to artifact from the metallic
stent at the right renal origin.

Again, review of her [REDACTED] chart confirms a stable creatinine at
in Friday January, 2022. There has also been no change in her blood
pressure management by nephrology. Blood pressure has been well
controlled.
IMPRESSION: Stable limited right renal artery duplex with persistent elevation
of the right renal artery as before. Right renal artery ratio at
2.4, previously 2.8. Stability of her creatinine and maintained
blood pressure control supports that the right renal artery stent
remains patent at 4 years.

## 2023-02-25 ENCOUNTER — Other Ambulatory Visit: Payer: Self-pay | Admitting: Interventional Radiology

## 2023-02-25 DIAGNOSIS — I701 Atherosclerosis of renal artery: Secondary | ICD-10-CM

## 2023-03-25 ENCOUNTER — Other Ambulatory Visit: Payer: Medicare HMO

## 2023-04-12 ENCOUNTER — Other Ambulatory Visit: Payer: Medicare HMO

## 2023-04-12 ENCOUNTER — Ambulatory Visit
Admission: RE | Admit: 2023-04-12 | Discharge: 2023-04-12 | Disposition: A | Discharge status: Home / Self Care | Payer: Medicare HMO | Source: Ambulatory Visit | Attending: Interventional Radiology | Admitting: Interventional Radiology

## 2023-04-12 DIAGNOSIS — I701 Atherosclerosis of renal artery: Secondary | ICD-10-CM

## 2023-04-13 ENCOUNTER — Ambulatory Visit
Admission: RE | Admit: 2023-04-13 | Discharge: 2023-04-13 | Disposition: A | Discharge status: Home / Self Care | Payer: Medicare HMO | Source: Ambulatory Visit | Attending: Interventional Radiology | Admitting: Interventional Radiology

## 2023-04-13 DIAGNOSIS — I701 Atherosclerosis of renal artery: Secondary | ICD-10-CM

## 2023-04-13 NOTE — Progress Notes (Signed)
Patient ID: Leslie Meza, female   DOB: 05-31-1951, 72 y.o.   MRN: [id number]       Chief Complaint:  Renovascular hypertension, diabetes  Referring Physician(s): Nwobu  History of Present Illness: Leslie Meza is a 72 y.o. female With diabetes, hyperlipidemia, hypertension and smoking.  She is now 5 years status post right renal artery stent placement for uncontrolled hypertension and chronic renal vascular disease.  Again, she has known chronic left renal artery occlusion and renal atrophy.  Epic chart reviewed extensively including care everywhere.  No change in blood pressure management.  She reports stable blood pressures from 130/140/50-60.  She remains on amlodipine, losartan, and Aldactone.  No change in dosing regimen.  She also remains on aspirin 325 mg daily for antiplatelet therapy.  Stable creatinine at 1.13 from 12/09/2022.  Overall she continues to do well.  No current complaints or symptoms.  No chest pain, dizziness, headaches, or other vascular symptoms.  Renal duplex today is essentially unchanged from prior years.  Stable clinical factors including creatinine and blood pressure management are more indicative that the right renal artery stent remains patent.  She was recently diagnosed with breast cancer.  She has undergone surgery already and will start radiation treatment in early May.  Past Medical History:  Diagnosis Date   Diabetes mellitus without complication (HCC)    Hyperlipidemia    Hypertension       Allergies: Influenza vaccines  Medications: Prior to Admission medications   Medication Sig Start Date End Date Taking? Authorizing Provider  albuterol (PROVENTIL HFA;VENTOLIN HFA) 108 (90 Base) MCG/ACT inhaler Inhale 1 puff into the lungs every 6 (six) hours as needed for wheezing or shortness of breath.    [provider]  amLODipine (NORVASC) 5 MG tablet Take 10 mg by mouth daily.     [provider]  aspirin 325 MG tablet Take 325 mg  by mouth daily.    [provider]  atorvastatin (LIPITOR) 40 MG tablet Take 40 mg by mouth daily.    [provider]  benazepril (LOTENSIN) 40 MG tablet Take 40 mg by mouth daily.    [provider]  docusate sodium (COLACE) 100 MG capsule Take 1 capsule (100 mg total) by mouth 2 (two) times daily. 01/12/15   Derwood Kaplan, MD  glipiZIDE (GLUCOTROL) 5 MG tablet Take by mouth daily before breakfast.    [provider]  loratadine (CLARITIN) 10 MG tablet Take 10 mg by mouth daily as needed for allergies.    [provider]  losartan (COZAAR) 50 MG tablet Take 50 mg by mouth daily.    [provider]  metFORMIN (GLUCOPHAGE) 500 MG tablet Take 500 mg by mouth 2 (two) times daily with a meal.    [provider]  metoprolol (LOPRESSOR) 50 MG tablet Take 50 mg by mouth 2 (two) times daily.    [provider]  polyethylene glycol (MIRALAX / GLYCOLAX) packet Take 17 g by mouth daily as needed.    [provider]  ranitidine (ZANTAC) 150 MG capsule Take 150 mg by mouth 2 (two) times daily.    [provider]  spironolactone (ALDACTONE) 25 MG tablet Take 25 mg by mouth daily.    [provider]     No family history on file.  Social History   Socioeconomic History   Marital status: Single    Spouse name: Not on file   Number of children: Not on file   Years of education:  Not on file   Highest education level: Not on file  Occupational History   Not on file  Tobacco Use   Smoking status: Every Day    Packs/day: .5    Types: Cigarettes   Smokeless tobacco: Not on file  Substance and Sexual Activity   Alcohol use: No   Drug use: No   Sexual activity: Not on file  Other Topics Concern   Not on file  Social History Narrative   Not on file   Social Determinants of Health   Financial Resource Strain: Not on file  Food Insecurity: Not on file  Transportation Needs: Not on file  Physical  Activity: Not on file  Stress: Not on file  Social Connections: Not on file    Review of Systems  Review of Systems: A 12 point ROS discussed and pertinent positives are indicated in the HPI above.  All other systems are negative.    Physical Exam No direct physical exam was performed Telehealth visit today to review interval imaging  Vital Signs: There were no vitals taken for this visit.  Imaging: US RENAL ARTERY DUPLEX COMPLETE  Result Date: 04/12/2023 CLINICAL DATA:  History of renal artery stenosis. F/u angioplasty 05/19/17 EXAM: RENAL/URINARY TRACT ULTRASOUND RENAL DUPLEX DOPPLER ULTRASOUND COMPARISON:  IR fluoroscopy, 05/19/2017. Renal artery Doppler, most recently 02/16/2022. FINDINGS: Suboptimal evaluation, with poor acoustic penetration secondary to patient habitus and shadowing from overlying bowel gas. Right Kidney: Length: 10.3 cm. Increased renal parenchymal echogenicity. No mass or hydronephrosis visualized. Left Kidney: Length: Atrophied. Bladder:  Nondistended RENAL DUPLEX ULTRASOUND Right Renal Artery Velocities: Origin:  324 cm/sec Mid:  146 cm/sec Hilum:  57 cm/sec Interlobar:  94 cm/sec Arcuate:  86 cm/sec The RIGHT renal vein is patent. Left Renal Artery Velocities: Atrophied. Aortic Velocity:  127 cm/sec Right Renal-Aortic Ratios: Origin: 2.6, (0.8) Mid:  1.1, (2.4) Hilum: 0.4 (1.7) Interlobar: 0.7, (0.8) Arcuate: 0.7, (0.4) Left Renal-Aortic Ratios: Atrophied. IMPRESSION: Suboptimal evaluation, within these constraints; 1. Chronic LEFT renal atrophy. 2. Elevation of the RIGHT proximal renal artery velocities and ratios, persistent and increased since 02/2022 comparison. If clinical concern for worsened renal artery stenosis, consider CTA for further evaluation. Roanna Banning, MD [facility] Radiology Electronically Signed   By: Roanna Banning M.D.   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  Known chronic left renal artery occlusion and renal atrophy.  Overall stable blood pressure management well-controlled on medical therapy.  Stable creatinine as well at 1.13 from December 2023.  No significant change in renal duplex velocity measurements.  Plan: This document is 5 years surveillance of a stable renal duplex as well as stable blood pressure management and baseline creatinine.  She has close follow-up with family medicine and nephrology in [facility].  At this point, no further renal ultrasound duplex surveillance imaging will be recommended after 5 years.  Follow-up with interventional will be on as-needed basis for any changes in blood pressure management or creatinine.    Electronically Signed: Berdine Dance 04/13/2023, 10:15 AM   I spent a total of    25 Minutes in remote  clinical consultation, greater than 50% of which was counseling/coordinating care for This patient with chronic renal vascular disease.    Visit type: Audio only (telephone). Audio (no video) only due to  patient's lack of internet/smartphone capability. Alternative for in-person consultation at [facility], [address] [place], [place], [place]. This visit type was conducted due to national recommendations for restrictions regarding the COVID-19 Pandemic (e.g. social distancing).  This format is felt to be most appropriate for this patient at this time.  All issues noted in this document were discussed and addressed.
